# Patient Record
Sex: Female | Born: 1955 | Race: Black or African American | Hispanic: No | Smoking: Former smoker
Health system: Southern US, Community
[De-identification: ages and names within clinical notes are randomized; demographics above are authoritative.]

## PROBLEM LIST (undated history)

## (undated) DIAGNOSIS — K759 Inflammatory liver disease, unspecified: Secondary | ICD-10-CM

## (undated) DIAGNOSIS — I10 Essential (primary) hypertension: Secondary | ICD-10-CM

## (undated) DIAGNOSIS — M1990 Unspecified osteoarthritis, unspecified site: Secondary | ICD-10-CM

## (undated) DIAGNOSIS — J45909 Unspecified asthma, uncomplicated: Secondary | ICD-10-CM

## (undated) HISTORY — PX: HERNIA REPAIR: SHX51

---

## 2019-08-10 ENCOUNTER — Emergency Department (HOSPITAL_COMMUNITY)
Admission: EM | Admit: 2019-08-10 | Discharge: 2019-08-10 | Disposition: A | Discharge status: Home / Self Care | Payer: Medicaid Other | Attending: Emergency Medicine | Admitting: Emergency Medicine

## 2019-08-10 ENCOUNTER — Encounter (HOSPITAL_COMMUNITY): Payer: Self-pay | Admitting: Emergency Medicine

## 2019-08-10 DIAGNOSIS — Z5321 Procedure and treatment not carried out due to patient leaving prior to being seen by health care provider: Secondary | ICD-10-CM | POA: Diagnosis not present

## 2019-08-10 DIAGNOSIS — M7918 Myalgia, other site: Secondary | ICD-10-CM | POA: Diagnosis present

## 2019-08-10 NOTE — ED Triage Notes (Signed)
Pt c/o bilat hip and leg pains 3 weeks. Had couple falls. Unable to walk due to pains.

## 2020-01-09 ENCOUNTER — Emergency Department (HOSPITAL_COMMUNITY): Payer: Medicaid Other

## 2020-01-09 ENCOUNTER — Encounter (HOSPITAL_COMMUNITY): Payer: Self-pay

## 2020-01-09 ENCOUNTER — Emergency Department (HOSPITAL_COMMUNITY)
Admission: EM | Admit: 2020-01-09 | Discharge: 2020-01-10 | Disposition: A | Discharge status: Home / Self Care | Payer: Medicaid Other | Attending: Emergency Medicine | Admitting: Emergency Medicine

## 2020-01-09 ENCOUNTER — Other Ambulatory Visit: Payer: Self-pay

## 2020-01-09 DIAGNOSIS — R1012 Left upper quadrant pain: Secondary | ICD-10-CM | POA: Diagnosis not present

## 2020-01-09 DIAGNOSIS — R1013 Epigastric pain: Secondary | ICD-10-CM | POA: Diagnosis present

## 2020-01-09 HISTORY — DX: Unspecified osteoarthritis, unspecified site: M19.90

## 2020-01-09 LAB — CBC WITH DIFFERENTIAL/PLATELET
Abs Immature Granulocytes: 0.01 10*3/uL (ref 0.00–0.07)
Basophils Absolute: 0 10*3/uL (ref 0.0–0.1)
Basophils Relative: 1 %
Eosinophils Absolute: 0.1 10*3/uL (ref 0.0–0.5)
Eosinophils Relative: 3 %
HCT: 37.2 % (ref 36.0–46.0)
Hemoglobin: 12.2 g/dL (ref 12.0–15.0)
Immature Granulocytes: 0 %
Lymphocytes Relative: 40 %
Lymphs Abs: 1.9 10*3/uL (ref 0.7–4.0)
MCH: 31 pg (ref 26.0–34.0)
MCHC: 32.8 g/dL (ref 30.0–36.0)
MCV: 94.4 fL (ref 80.0–100.0)
Monocytes Absolute: 0.6 10*3/uL (ref 0.1–1.0)
Monocytes Relative: 13 %
Neutro Abs: 2.1 10*3/uL (ref 1.7–7.7)
Neutrophils Relative %: 43 %
Platelets: 350 10*3/uL (ref 150–400)
RBC: 3.94 MIL/uL (ref 3.87–5.11)
RDW: 11.4 % — ABNORMAL LOW (ref 11.5–15.5)
WBC: 4.8 10*3/uL (ref 4.0–10.5)
nRBC: 0 % (ref 0.0–0.2)

## 2020-01-09 LAB — COMPREHENSIVE METABOLIC PANEL
ALT: 16 U/L (ref 0–44)
AST: 22 U/L (ref 15–41)
Albumin: 3.8 g/dL (ref 3.5–5.0)
Alkaline Phosphatase: 55 U/L (ref 38–126)
Anion gap: 13 (ref 5–15)
BUN: 20 mg/dL (ref 8–23)
CO2: 28 mmol/L (ref 22–32)
Calcium: 9.9 mg/dL (ref 8.9–10.3)
Chloride: 101 mmol/L (ref 98–111)
Creatinine, Ser: 0.75 mg/dL (ref 0.44–1.00)
GFR, Estimated: >60 mL/min (ref >60)
Glucose, Bld: 104 mg/dL — ABNORMAL HIGH (ref 70–99)
Potassium: 3 mmol/L — ABNORMAL LOW (ref 3.5–5.1)
Sodium: 142 mmol/L (ref 135–145)
Total Bilirubin: 0.6 mg/dL (ref 0.3–1.2)
Total Protein: 9.1 g/dL — ABNORMAL HIGH (ref 6.5–8.1)

## 2020-01-09 LAB — LIPASE, BLOOD: Lipase: 27 U/L (ref 11–51)

## 2020-01-09 LAB — ETHANOL: Alcohol, Ethyl (B): <10 mg/dL (ref <10)

## 2020-01-09 LAB — ACETAMINOPHEN LEVEL: Acetaminophen (Tylenol), Serum: <10 ug/mL — ABNORMAL LOW (ref 10–30)

## 2020-01-09 LAB — LACTIC ACID, PLASMA: Lactic Acid, Venous: 1.2 mmol/L (ref 0.5–1.9)

## 2020-01-09 LAB — SALICYLATE LEVEL: Salicylate Lvl: <7 mg/dL — ABNORMAL LOW (ref 7.0–30.0)

## 2020-01-09 MED ORDER — SODIUM CHLORIDE 0.9 % IV BOLUS
1000.0000 mL | Freq: Once | INTRAVENOUS | Status: AC
  Administered 2020-01-09: 1000 mL via INTRAVENOUS

## 2020-01-09 MED ORDER — MORPHINE SULFATE (PF) 4 MG/ML IV SOLN
4.0000 mg | Freq: Once | INTRAVENOUS | Status: AC
  Administered 2020-01-09: 4 mg via INTRAVENOUS
  Filled 2020-01-09: qty 1

## 2020-01-09 MED ORDER — IOHEXOL 300 MG/ML  SOLN
100.0000 mL | Freq: Once | INTRAMUSCULAR | Status: AC | PRN
  Administered 2020-01-09: 100 mL via INTRAVENOUS

## 2020-01-09 MED ORDER — POTASSIUM CHLORIDE CRYS ER 20 MEQ PO TBCR
40.0000 meq | EXTENDED_RELEASE_TABLET | Freq: Once | ORAL | Status: AC
  Administered 2020-01-10: 40 meq via ORAL
  Filled 2020-01-09: qty 2

## 2020-01-09 NOTE — ED Notes (Signed)
Ultrasound at pt bedside 

## 2020-01-09 NOTE — ED Triage Notes (Signed)
Patient arrived with complaints of abdominal pain over the last 4 days. Declines NVD. Per EMS family mentions history of substance abuse, reports finding a bottle of Trazadone filled 5 days ago for one tablet a day with 15 missing from bottle.

## 2020-01-09 NOTE — Discharge Instructions (Signed)
The CT scan and ultrasound shows gall bladder sludge. This is not a surgical emergency. If she continues to have this abdominal pain she should follow up outpatient with the surgery clinic. Their office information is included in discharge paperwork.  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 You should follow up with an orthopedist for this if you have hip pain and have not seen on before. I have included the number for the on call orthopedist Dr. Ranell Patrick.  You should

## 2020-01-09 NOTE — ED Notes (Signed)
Patient transported to CT 

## 2020-01-09 NOTE — ED Provider Notes (Signed)
Medical screening examination/treatment/procedure(s) were conducted as a shared visit with non-physician practitioner(s) and myself.  I personally evaluated the patient during the encounter.  EKG Interpretation  Date/Time:  Tuesday January 09 2020 20:37:35 EDT Ventricular Rate:  86 PR Interval:    QRS Duration: 81 QT Interval:  408 QTC Calculation: 488 R Axis:   69 Text Interpretation: Sinus rhythm LAE, consider biatrial enlargement Borderline prolonged QT interval No old tracing to compare Confirmed by Lorre Nick ([id number]) on 01/09/2020 9:38:4 PM  64 year old female presents with 4 days abdominal pain characterized to her left upper quadrant but diffuse in nature.  On exam she is diffusely tender.  Will order abdominal CT and labs and disposition pending at this time.   Lorre Nick, MD 01/09/20 2136

## 2020-01-09 NOTE — ED Provider Notes (Addendum)
[facility]-EMERGENCY DEPT Provider Note   CSN: [id number] Arrival date & time: 01/09/20  2019     History Chief Complaint  Patient presents with  . Abdominal Pain    Kara Fox is a 64 y.o. female with past medical history significant for alcohol and substance abuse, arthritis. Abdominal surgical history includes hernia repair and C sections.  HPI Patient presents to emergency department today via EMS with chief complaint of LUQ abdominal pain x 4 days. Pain is intermittent. She describes the pain as a cramping sensation. She rates pain 7/10 in severity. Pain is located in her LUQ and does not radiate. Pain is not worse with eating or drinking. She has been taking her home medications for pain, denies any additional OTC medications. She denies drinking any alcohol today, states she drank yesterday. She admits to drinking frequently. She was recently prescribed flagyl to help decrease alcohol consumption. Denies history of similar pain. Also denies fever, chills, shortness of breath, chest pain, nausea, emesis, urinary frequency, gross hematuria, diarrhea, black tarry stool. Last bowel movement was this morning and was normal for her.  Patient gave approval to contact daughter for additional history. Patient's daughter states she has long history of alcohol abuse. She was recently at a detox clinic for 2 weeks discharged on 10/22. Patient goes to local methadone clinic. She does not know of any recent failed urine drug tests. Patient has a prescription for trazodone that daughter filled on 10/28. She is supposed to only take 1 at night, daughter noticed there were 14 pills missing. Patient denies overdose or any thoughts of self harm. She denies taking large quantities of trazodone today or recently.       Past Medical History:  Diagnosis Date  . Arthritis     There are no problems to display for this patient.   No past surgical history on file.   OB  History   No obstetric history on file.     No family history on file.  Social History   Tobacco Use  . Smoking status: Not on file  Substance Use Topics  . Alcohol use: Not on file  . Drug use: Not on file    Home Medications Prior to Admission medications   Medication Sig Start Date End Date Taking? Authorizing Provider  celecoxib (CELEBREX) 100 MG capsule Take 100 mg by mouth 2 (two) times daily. 11/21/19  Yes [provider]  DULoxetine (CYMBALTA) 30 MG capsule Take 30 mg by mouth 2 (two) times daily.  11/21/19  Yes [provider]  hydrOXYzine (VISTARIL) 25 MG capsule Take 25 mg by mouth every 6 (six) hours as needed for anxiety or itching.  01/04/20  Yes [provider]  Misc Natural Products (TURMERIC CURCUMIN) CAPS Take 1 capsule by mouth daily.  11/21/19  Yes [provider]  traZODone (DESYREL) 50 MG tablet Take 50 mg by mouth at bedtime. 01/04/20  Yes [provider]    Allergies    Patient has no known allergies.  Review of Systems   Review of Systems All other systems are reviewed and are negative for acute change except as noted in the HPI.  Physical Exam Updated Vital Signs BP (!) 141/89 (BP Location: Left Arm)   Pulse 88   Temp 98.4 F (36.9 C) (Oral)   Resp 15   SpO2 100%   Physical Exam Vitals and nursing note reviewed.  Constitutional:      General: She is not in  acute distress.    Appearance: She is not ill-appearing.  HENT:     Head: Normocephalic and atraumatic.     Right Ear: Tympanic membrane and external ear normal.     Left Ear: Tympanic membrane and external ear normal.     Nose: Nose normal.     Mouth/Throat:     Mouth: Mucous membranes are dry.     Pharynx: Oropharynx is clear.  Eyes:     General: No scleral icterus.       Right eye: No discharge.        Left eye: No discharge.     Extraocular Movements: Extraocular movements intact.     Conjunctiva/sclera: Conjunctivae normal.      Pupils: Pupils are equal, round, and reactive to light.  Neck:     Vascular: No JVD.  Cardiovascular:     Rate and Rhythm: Normal rate and regular rhythm.     Pulses: Normal pulses.          Radial pulses are 2+ on the right side and 2+ on the left side.     Heart sounds: Normal heart sounds.  Pulmonary:     Comments: Lungs clear to auscultation in all fields. Symmetric chest rise. No wheezing, rales, or rhonchi. Abdominal:     Comments: Abdomen is soft, non-distended. Tender to palpation of LUQ and LLQ. No rigidity, no guarding. No peritoneal signs. No CVA tenderness.  Musculoskeletal:        General: Normal range of motion.     Cervical back: Normal range of motion.  Skin:    General: Skin is warm and dry.     Capillary Refill: Capillary refill takes less than 2 seconds.  Neurological:     Mental Status: She is oriented to person, place, and time.     GCS: GCS eye subscore is 4. GCS verbal subscore is 5. GCS motor subscore is 6.     Comments: Fluent speech, no facial droop.  Psychiatric:        Behavior: Behavior normal.     ED Results / Procedures / Treatments   Labs (all labs ordered are listed, but only abnormal results are displayed) Labs Reviewed  CBC WITH DIFFERENTIAL/PLATELET - Abnormal; Notable for the following components:      Result Value   RDW 11.4 (*)    All other components within normal limits  COMPREHENSIVE METABOLIC PANEL - Abnormal; Notable for the following components:   Potassium 3.0 (*)    Glucose, Bld 104 (*)    Total Protein 9.1 (*)    All other components within normal limits  ACETAMINOPHEN LEVEL - Abnormal; Notable for the following components:   Acetaminophen (Tylenol), Serum <10 (*)    All other components within normal limits  SALICYLATE LEVEL - Abnormal; Notable for the following components:   Salicylate Lvl <7.0 (*)    All other components within normal limits  ETHANOL  LIPASE, BLOOD  LACTIC ACID, PLASMA    EKG EKG  Interpretation  Date/Time:  Tuesday January 09 2020 20:37:35 EDT Ventricular Rate:  86 PR Interval:    QRS Duration: 81 QT Interval:  408 QTC Calculation: 488 R Axis:   69 Text Interpretation: Sinus rhythm LAE, consider biatrial enlargement Borderline prolonged QT interval No old tracing to compare Confirmed by Lorre Nick ([id number]) on 01/09/2020 9:19:58 PM   Radiology CT ABDOMEN PELVIS W CONTRAST  Result Date: 01/09/2020 CLINICAL DATA:  Left upper quadrant pain.  History of substance use. EXAM: CT  ABDOMEN AND PELVIS WITH CONTRAST TECHNIQUE: Multidetector CT imaging of the abdomen and pelvis was performed using the standard protocol following bolus administration of intravenous contrast. CONTRAST:  OMNIPAQUE IOHEXOL 300 MG/ML  SOLN COMPARISON:  None. FINDINGS: Lower chest: Bilateral lower lobe subsegmental atelectasis. No acute abnormality. Hepatobiliary: No focal liver abnormality. Layering hyperdensity within the gallbladder lumen suggestive of gallbladder sludge versus tiny gallstones. No gallbladder wall thickening or pericholecystic fluid. No biliary dilatation. Pancreas: No focal lesion. Normal pancreatic contour. No surrounding inflammatory changes. No main pancreatic ductal dilatation. Spleen: Normal in size without focal abnormality. Adrenals/Urinary Tract: No adrenal nodule bilaterally. Bilateral kidneys enhance symmetrically. No hydronephrosis. No hydroureter. The urinary bladder is unremarkable. Stomach/Bowel: Stomach is within normal limits. No evidence of bowel wall thickening or dilatation. Appendix appears normal. Vascular/Lymphatic: No abdominal aorta or iliac aneurysm. Mild atherosclerotic plaque of the aorta and its branches. No abdominal, pelvic, or inguinal lymphadenopathy. Reproductive: Uterus and bilateral adnexa are unremarkable. Other: No intraperitoneal free fluid. No intraperitoneal free gas. No organized fluid collection. Musculoskeletal: Suggestion of ventral hernia  repair. No abdominal wall hernia or abnormality No suspicious lytic or blastic osseous lesions. No acute displaced fracture. Multilevel degenerative changes of the spine with grade 1 anterolisthesis of L4 on L5. Extensive bilateral hip degenerative changes with remodeling of the right femoral head. IMPRESSION: 1. Gallbladder sludge versus tiny gallstones with no findings to suggest acute cholecystitis or choledocholithiasis. 2. Extensive bilateral hip degenerative changes with remodeling of the right femoral head. If not already followed, please consider nonemergent orthopedic consultation. Electronically Signed   By: Tish Frederickson M.D.   On: 01/09/2020 22:14   US Abdomen Limited RUQ (LIVER/GB)  Result Date: 01/09/2020 CLINICAL DATA:  Epigastric pain for 4 days EXAM: ULTRASOUND ABDOMEN LIMITED RIGHT UPPER QUADRANT COMPARISON:  01/09/2020 FINDINGS: Gallbladder: Gallbladder sludge layers dependently within the gallbladder. No shadowing gallstones or evidence of cholecystitis. Common bile duct: Diameter: 4 mm Liver: No focal lesion identified. Within normal limits in parenchymal echogenicity. Portal vein is patent on color Doppler imaging with normal direction of blood flow towards the liver. Other: None. IMPRESSION: 1. Gallbladder sludge. No evidence of cholelithiasis or cholecystitis. Electronically Signed   By: Sharlet Salina M.D.   On: 01/09/2020 23:00    Procedures Procedures (including critical care time)  Medications Ordered in ED Medications  potassium chloride SA (KLOR-CON) CR tablet 40 mEq (has no administration in time range)  sodium chloride 0.9 % bolus 1,000 mL (1,000 mLs Intravenous New Bag/Given 01/09/20 2132)  iohexol (OMNIPAQUE) 300 MG/ML solution 100 mL (100 mLs Intravenous Contrast Given 01/09/20 2149)  morphine 4 MG/ML injection 4 mg (4 mg Intravenous Given 01/09/20 2240)    ED Course  I have reviewed the triage vital signs and the nursing notes.  Pertinent labs & imaging results  that were available during my care of the patient were reviewed by me and considered in my medical decision making (see chart for details).    MDM Rules/Calculators/A&P                          History provided by patient with additional history obtained from chart review.    Patient presents to the ED with complaints of abdominal pain. Patient nontoxic appearing, in no apparent distress, vitals WNL. On exam patient tender to LUQ abd LLQ, no peritoneal signs. Will evaluate with labs and CT. Analgesics and fluids administered.  Labs reviewed and grossly unremarkable. No leukocytosis, no anemia.  Mild hypokalemia 3.0, will replete PO. No lactic acidosis. LFTs, renal function, and lipase WNL. Ethanol, acetaminophen level, salicylate levels all negative. Patient used the restroom without getting UA. She declines any urinary symptoms and does not feel she can provide another sample, ordered discontinued.  RUQ US shows gall bladder sludge. CT AP shows gallbladder sludge, no gallstones or evidence of cholecystitis. Radiologist comments on extensive bilateral hip degenerative changes with remodeling of the right femoral head.  Nonemergent orthopedic consultation recommended. She has full ROM of bilateral hips, pelvis is stable. No overlying skin changes . On repeat abdominal exam patient remains without peritoneal signs, doubt cholecystitis, pancreatitis, diverticulitis, appendicitis, bowel obstruction/perforation.  Patient tolerating PO in the emergency department. Will discharge home with supportive measures. Recommend follow up in surgery clinic as well as ortho for hip pain. I discussed results, treatment plan,and return precautions with the patient and her daughter. Provided opportunity for questions, patient confirmed understanding and is in agreement with plan. Findings and plan of care discussed with supervising physician Dr. Freida BusmanAllen.   Portions of this note were generated with [employer]ist[employer] dictation  software. Dictation errors may occur despite best attempts at proofreading.    Final Clinical Impression(s) / ED Diagnoses Final diagnoses:  Epigastric pain    Rx / DC Orders ED Discharge Orders    None       Shanon AceWalisiewicz, Fortune Brannigan E, PA-C 01/10/20 0014    Shanon AceWalisiewicz, Mckayla Mulcahey E, PA-C 01/10/20 0035    Lorre NickAllen, Anthony, MD 01/10/20 2336

## 2020-01-10 NOTE — ED Notes (Signed)
Contacted pts daughter for pt pick up after discharge. Pts daughter states she would be calling an [employer] to pick up.

## 2020-05-16 ENCOUNTER — Other Ambulatory Visit (HOSPITAL_COMMUNITY): Payer: Self-pay | Admitting: Internal Medicine

## 2020-05-20 ENCOUNTER — Other Ambulatory Visit: Payer: Self-pay | Admitting: Internal Medicine

## 2020-05-20 DIAGNOSIS — R5381 Other malaise: Secondary | ICD-10-CM

## 2020-05-24 ENCOUNTER — Other Ambulatory Visit: Payer: Self-pay | Admitting: Physician Assistant

## 2020-05-24 DIAGNOSIS — Z1231 Encounter for screening mammogram for malignant neoplasm of breast: Secondary | ICD-10-CM

## 2020-06-11 ENCOUNTER — Other Ambulatory Visit (HOSPITAL_COMMUNITY): Payer: Self-pay

## 2020-06-11 MED ORDER — DICLOFENAC SODIUM 3 % EX GEL
1.0000 "application " | Freq: Two times a day (BID) | CUTANEOUS | 5 refills | Status: DC
  Filled 2020-06-11: qty 100, 5d supply, fill #0

## 2020-06-11 MED ORDER — CELECOXIB 100 MG PO CAPS
100.0000 mg | ORAL_CAPSULE | Freq: Two times a day (BID) | ORAL | 3 refills | Status: DC
  Filled 2020-06-11: qty 60, 30d supply, fill #0

## 2020-06-11 MED ORDER — HYDROCHLOROTHIAZIDE 12.5 MG PO TABS
12.5000 mg | ORAL_TABLET | Freq: Every day | ORAL | 4 refills | Status: DC
  Filled 2020-06-11: qty 30, 30d supply, fill #0

## 2020-06-11 MED ORDER — GABAPENTIN 100 MG PO CAPS
100.0000 mg | ORAL_CAPSULE | Freq: Three times a day (TID) | ORAL | 3 refills | Status: DC
  Filled 2020-06-11: qty 90, 30d supply, fill #0

## 2020-06-21 ENCOUNTER — Other Ambulatory Visit (HOSPITAL_COMMUNITY): Payer: Self-pay

## 2020-06-21 MED ORDER — CHOLECALCIFEROL 25 MCG (1000 UT) PO TABS: ORAL_TABLET | ORAL | 3 refills | Status: DC

## 2020-07-22 ENCOUNTER — Other Ambulatory Visit: Payer: Self-pay | Admitting: Internal Medicine

## 2020-07-22 DIAGNOSIS — Z13820 Encounter for screening for osteoporosis: Secondary | ICD-10-CM

## 2020-07-23 ENCOUNTER — Other Ambulatory Visit: Payer: Medicaid Other

## 2020-08-26 ENCOUNTER — Emergency Department (HOSPITAL_COMMUNITY)
Admission: EM | Admit: 2020-08-26 | Discharge: 2020-08-26 | Disposition: A | Discharge status: Home / Self Care | Payer: Medicaid Other | Attending: Emergency Medicine | Admitting: Emergency Medicine

## 2020-08-26 ENCOUNTER — Other Ambulatory Visit: Payer: Self-pay

## 2020-08-26 ENCOUNTER — Encounter (HOSPITAL_COMMUNITY): Payer: Self-pay

## 2020-08-26 DIAGNOSIS — Z79899 Other long term (current) drug therapy: Secondary | ICD-10-CM | POA: Diagnosis not present

## 2020-08-26 DIAGNOSIS — M25551 Pain in right hip: Secondary | ICD-10-CM | POA: Insufficient documentation

## 2020-08-26 DIAGNOSIS — M25552 Pain in left hip: Secondary | ICD-10-CM | POA: Insufficient documentation

## 2020-08-26 DIAGNOSIS — Z760 Encounter for issue of repeat prescription: Secondary | ICD-10-CM | POA: Diagnosis not present

## 2020-08-26 DIAGNOSIS — M25562 Pain in left knee: Secondary | ICD-10-CM | POA: Diagnosis not present

## 2020-08-26 DIAGNOSIS — M25561 Pain in right knee: Secondary | ICD-10-CM | POA: Insufficient documentation

## 2020-08-26 DIAGNOSIS — Z87891 Personal history of nicotine dependence: Secondary | ICD-10-CM | POA: Diagnosis not present

## 2020-08-26 DIAGNOSIS — I10 Essential (primary) hypertension: Secondary | ICD-10-CM | POA: Insufficient documentation

## 2020-08-26 HISTORY — DX: Essential (primary) hypertension: I10

## 2020-08-26 MED ORDER — METHOCARBAMOL 500 MG PO TABS
500.0000 mg | ORAL_TABLET | Freq: Once | ORAL | Status: AC
  Administered 2020-08-26: 17:00:00 500 mg via ORAL
  Filled 2020-08-26: qty 1

## 2020-08-26 MED ORDER — ACETAMINOPHEN 500 MG PO TABS
1000.0000 mg | ORAL_TABLET | Freq: Once | ORAL | Status: AC
  Administered 2020-08-26: 17:00:00 1000 mg via ORAL
  Filled 2020-08-26: qty 2

## 2020-08-26 MED ORDER — CELECOXIB 100 MG PO CAPS: 100.0000 mg | ORAL_CAPSULE | Freq: Two times a day (BID) | ORAL | 0 refills | Status: DC

## 2020-08-26 MED ORDER — METHOCARBAMOL 500 MG PO TABS: 500.0000 mg | ORAL_TABLET | Freq: Three times a day (TID) | ORAL | 0 refills | Status: DC | PRN

## 2020-08-26 NOTE — ED Triage Notes (Signed)
Per EMS- patient states she has ran out of her arthritis medications x 3 weeks.  Patient c/o pain in her hips and knees.

## 2020-08-26 NOTE — Discharge Instructions (Addendum)
I have provided you with refills of your Celebrex and Robaxin. Is important for you to establish care with a primary care provider.  I have consulted the social worker to help arrange this for you. Return to the ER if you start to experience worsening pain, fever, chest pain or shortness of breath

## 2020-08-26 NOTE — ED Provider Notes (Signed)
[facility]-EMERGENCY DEPT Provider Note   CSN: [id number] Arrival date & time: 08/26/20  1430     History Chief Complaint  Patient presents with   Medication Refill   arthritis pain    Kara Fox is a 65 y.o. female with a past medical history of arthritis, hypertension presenting to the ED for medication refill.  States that she has been out of her medications for about 3 days.  Has been having arthritis related pain in bilateral knees and hips.  She has been dealing with this overall for several years.  She recently moved to [facility] and has not yet established care with PCP or a "bone specialist" but she is working on this.  Denies any injuries or falls, fever or numbness.  HPI     Past Medical History:  Diagnosis Date   Arthritis    Hypertension     There are no problems to display for this patient.   History reviewed. No pertinent surgical history.   OB History   No obstetric history on file.     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 Authorizing Provider  celecoxib (CELEBREX) 100 MG capsule Take 1 capsule (100 mg total) by mouth 2 (two) times daily. 08/26/20  Yes Maki Sweetser, PA-C  methocarbamol (ROBAXIN) 500 MG tablet Take 1 tablet (500 mg total) by mouth every 8 (eight) hours as needed for muscle spasms. 08/26/20  Yes Maythe Deramo, Hillary Bow, PA-C  Cholecalciferol 25 MCG (1000 UT) tablet Take 1 tablet by mouth once a day 06/11/20     Diclofenac Sodium 3 % GEL Apply 1 application topically 2 (two) times daily. 06/11/20     DULoxetine (CYMBALTA) 30 MG capsule Take 30 mg by mouth 2 (two)  times daily.  11/21/19   [provider]  folic acid (FOLVITE) 1 MG tablet TAKE 1 TABLET BY MOUTH ONCE A DAY 90 DAYS 05/16/20 05/16/21  Osei-Bonsu, Greggory Stallion, MD  gabapentin (NEURONTIN) 100 MG capsule Take 1 capsule by mouth 3 times a day 06/11/20     hydrochlorothiazide (HYDRODIURIL) 12.5 MG tablet Take 1 tablet (12.5 mg total) by mouth daily. 06/11/20     hydrOXYzine (VISTARIL) 25 MG capsule Take 25 mg by mouth every 6 (six) hours as needed for anxiety or itching.  01/04/20   [provider]  Misc Natural Products (TURMERIC CURCUMIN) CAPS Take 1 capsule by mouth daily.  11/21/19   [provider]  thiamine 100 MG tablet TAKE 1 TABLET BY MOUTH ONCE DAILY 05/16/20 05/16/21  Jackie Plum, MD  traZODone (DESYREL) 50 MG tablet Take 50 mg by mouth at bedtime. 01/04/20   [provider]    Allergies    Patient has no known allergies.  Review of Systems   Review of Systems  Constitutional:  Negative for fever.  Musculoskeletal:  Positive for arthralgias.  Neurological:  Negative for weakness and numbness.   Physical Exam Updated Vital Signs BP (!) 135/107   Pulse 86   Temp 98.9 F (37.2 C) (Oral)   Resp 16  Ht 5' (1.524 m)   Wt 57.6 kg   SpO2 100%   BMI 24.80 kg/m   Physical Exam Vitals and nursing note reviewed.  Constitutional:      General: She is not in acute distress.    Appearance: She is well-developed. She is not diaphoretic.  HENT:     Head: Normocephalic and atraumatic.  Eyes:     General: No scleral icterus.    Conjunctiva/sclera: Conjunctivae normal.  Pulmonary:     Effort: Pulmonary effort is normal. No respiratory distress.  Musculoskeletal:     Cervical back: Normal range of motion.     Comments: Normal range of motion of bilateral hips and knees without overlying skin changes.  Skin:    Findings: No rash.  Neurological:     Mental Status: She is alert.    ED Results / Procedures / Treatments   Labs (all labs ordered are  listed, but only abnormal results are displayed) Labs Reviewed - No data to display  EKG None  Radiology No results found.  Procedures Procedures   Medications Ordered in ED Medications  methocarbamol (ROBAXIN) tablet 500 mg (500 mg Oral Given 08/26/20 1642)  acetaminophen (TYLENOL) tablet 1,000 mg (1,000 mg Oral Given 08/26/20 1642)    ED Course  I have reviewed the triage vital signs and the nursing notes.  Pertinent labs & imaging results that were available during my care of the patient were reviewed by me and considered in my medical decision making (see chart for details).  Clinical Course as of 08/26/20 1646  Mon Aug 26, 2020  1621 Robaxin 500 tid prn Celebrex 100 bid Duloxetine 30 bid [HK]    Clinical Course User Index [HK] Dietrich Pates, PA-C   MDM Rules/Calculators/A&P                          65 year old female with past medical history of arthritis presenting to the ED with a chief complaint of arthritis pain needing refill of her medications.  Pill bottles at the bedside.  She is out of her Celebrex and Robaxin.  Initially gave me an empty bottle of duloxetine but on recheck she has another bottle that is full.  She denies any other complaints.  We will have her establish care with a PCP and have social work to contact her.  Return precautions given.   Patient is hemodynamically stable, in NAD, and able to ambulate in the ED. Evaluation does not show pathology that would require ongoing emergent intervention or inpatient treatment. I explained the diagnosis to the patient. Pain has been managed and has no complaints prior to discharge. Patient is comfortable with above plan and is stable for discharge at this time. All questions were answered prior to disposition. Strict return precautions for returning to the ED were discussed. Encouraged follow up with PCP.   An After Visit Summary was printed and given to the patient.   Portions of this note were generated with  Scientist, clinical (histocompatibility and immunogenetics). Dictation errors may occur despite best attempts at proofreading.  Final Clinical Impression(s) / ED Diagnoses Final diagnoses:  Encounter for medication refill    Rx / DC Orders ED Discharge Orders          Ordered    celecoxib (CELEBREX) 100 MG capsule  2 times daily        08/26/20 1646    methocarbamol (ROBAXIN) 500 MG tablet  Every 8 hours PRN  08/26/20 1646             Dietrich Pates, PA-C 08/26/20 1646    Rolan Bucco, MD 08/27/20 [phone]

## 2020-08-27 ENCOUNTER — Telehealth: Payer: Self-pay | Admitting: *Deleted

## 2020-08-27 NOTE — Telephone Encounter (Signed)
Unable to reach patient for virtual pre visit. Left message on home phone asking patient to call and reschedule pv to avoid cancellation of up-coming colonoscopy 09/26/20. Of note, cell phone number incorrect number belonged to another person.

## 2020-08-27 NOTE — Telephone Encounter (Signed)
Patient no showed PV today- Called patient and left message to return call by 5 pm today- If no call by 5 pm, PV and procedure will be canceled - no call at 5 pm -  PV and Procedure both canceled- No Show letter mailed to patient  

## 2020-09-17 ENCOUNTER — Inpatient Hospital Stay: Admission: RE | Admit: 2020-09-17 | Payer: Medicaid Other | Source: Ambulatory Visit

## 2020-09-24 ENCOUNTER — Inpatient Hospital Stay: Admission: RE | Admit: 2020-09-24 | Payer: Medicaid Other | Source: Ambulatory Visit

## 2020-09-26 ENCOUNTER — Encounter: Payer: Medicaid Other | Admitting: Gastroenterology

## 2020-10-02 ENCOUNTER — Telehealth: Payer: Self-pay | Admitting: *Deleted

## 2020-10-02 NOTE — Telephone Encounter (Signed)
Patient no show for PV today. Called patient, no answer. "Mail box is full" so I was unable to leave a message. No show letter mailed to patient today. Colon and PV cancelled.

## 2020-10-16 ENCOUNTER — Encounter: Payer: Medicaid Other | Admitting: Gastroenterology

## 2020-10-16 ENCOUNTER — Encounter: Payer: Self-pay | Admitting: Gastroenterology

## 2020-11-05 ENCOUNTER — Telehealth: Payer: Self-pay | Admitting: *Deleted

## 2020-11-05 NOTE — Telephone Encounter (Signed)
Patient no show and did not call back today. Mailed no show letter.

## 2020-11-05 NOTE — Telephone Encounter (Signed)
Patient no show PV appointment for today. I called patient, no answer, left a message for the patient to call us back today. 

## 2020-11-05 NOTE — Telephone Encounter (Signed)
Patient no show PV appointment for today. I called patient, no answer, left a message for the patient to call us back today before 5 pm or the PV and procedure will be cancelled  

## 2020-11-18 ENCOUNTER — Encounter: Payer: Medicaid Other | Admitting: Gastroenterology

## 2021-01-08 ENCOUNTER — Inpatient Hospital Stay: Admission: RE | Admit: 2021-01-08 | Payer: Medicaid Other | Source: Ambulatory Visit

## 2021-04-15 ENCOUNTER — Other Ambulatory Visit: Payer: Self-pay | Admitting: Internal Medicine

## 2021-04-15 ENCOUNTER — Other Ambulatory Visit: Payer: Self-pay

## 2021-04-15 MED ORDER — GABAPENTIN 100 MG PO CAPS
ORAL_CAPSULE | ORAL | 3 refills | Status: DC
  Filled 2021-04-15: qty 90, 30d supply, fill #0

## 2021-04-15 MED ORDER — HYDROCHLOROTHIAZIDE 12.5 MG PO TABS
ORAL_TABLET | ORAL | 5 refills | Status: DC
  Filled 2021-04-15: qty 30, 30d supply, fill #0

## 2021-04-15 MED ORDER — CELECOXIB 100 MG PO CAPS
ORAL_CAPSULE | ORAL | 3 refills | Status: DC
  Filled 2021-04-15: qty 60, 30d supply, fill #0

## 2021-04-22 ENCOUNTER — Other Ambulatory Visit: Payer: Self-pay

## 2021-08-20 ENCOUNTER — Other Ambulatory Visit: Payer: Self-pay

## 2021-08-20 MED ORDER — GABAPENTIN 100 MG PO CAPS
ORAL_CAPSULE | ORAL | 3 refills | Status: DC
  Filled 2021-08-20: qty 90, 30d supply, fill #0

## 2021-08-20 MED ORDER — HYDROCHLOROTHIAZIDE 12.5 MG PO TABS
ORAL_TABLET | ORAL | 5 refills | Status: DC
  Filled 2021-08-20: qty 30, 30d supply, fill #0

## 2021-08-27 ENCOUNTER — Other Ambulatory Visit: Payer: Self-pay

## 2021-08-28 ENCOUNTER — Encounter: Payer: Self-pay | Admitting: Podiatry

## 2021-08-28 ENCOUNTER — Ambulatory Visit (INDEPENDENT_AMBULATORY_CARE_PROVIDER_SITE_OTHER): Payer: Medicaid Other | Admitting: Podiatry

## 2021-08-28 DIAGNOSIS — M79675 Pain in left toe(s): Secondary | ICD-10-CM | POA: Diagnosis not present

## 2021-08-28 DIAGNOSIS — B351 Tinea unguium: Secondary | ICD-10-CM | POA: Diagnosis not present

## 2021-08-28 DIAGNOSIS — M79674 Pain in right toe(s): Secondary | ICD-10-CM

## 2021-09-03 NOTE — Progress Notes (Signed)
  Subjective:  Patient ID: Kara Fox, female    DOB: 04/01/1955,  MRN: [id number]  Chief Complaint  Patient presents with   Nail Problem    Nail trim    66 y.o. female returns for the above complaint.  Patient presents with thickened elongated dystrophic toenails x10 hurts with ambulation.  Patient would like to have it debrided down she is not able to do it herself.  She denies any other acute complaints  Objective:  There were no vitals filed for this visit. Podiatric Exam: Vascular: dorsalis pedis and posterior tibial pulses are palpable bilateral. Capillary return is immediate. Temperature gradient is WNL. Skin turgor WNL  Sensorium: Normal Semmes Weinstein monofilament test. Normal tactile sensation bilaterally. Nail Exam: Pt has thick disfigured discolored nails with subungual debris noted bilateral entire nail hallux through fifth toenails.  Pain on palpation to the nails. Ulcer Exam: There is no evidence of ulcer or pre-ulcerative changes or infection. Orthopedic Exam: Muscle tone and strength are WNL. No limitations in general ROM. No crepitus or effusions noted.  Skin: No Porokeratosis. No infection or ulcers    Assessment & Plan:   1. Pain due to onychomycosis of toenails of both feet     Patient was evaluated and treated and all questions answered.  Onychomycosis with pain  -Nails palliatively debrided as below. -Educated on self-care  Procedure: Nail Debridement Rationale: pain  Type of Debridement: manual, sharp debridement. Instrumentation: Nail nipper, rotary burr. Number of Nails: 10  Procedures and Treatment: Consent by patient was obtained for treatment procedures. The patient understood the discussion of treatment and procedures well. All questions were answered thoroughly reviewed. Debridement of mycotic and hypertrophic toenails, 1 through 5 bilateral and clearing of subungual debris. No ulceration, no infection noted.  Return Visit-Office  Procedure: Patient instructed to return to the office for a follow up visit 3 months for continued evaluation and treatment.  Nicholes Rough, DPM    Return in about 3 months (around 11/28/2021).

## 2021-11-20 ENCOUNTER — Other Ambulatory Visit: Payer: Self-pay

## 2021-11-20 MED ORDER — GABAPENTIN 100 MG PO CAPS
100.0000 mg | ORAL_CAPSULE | Freq: Three times a day (TID) | ORAL | 3 refills | Status: DC
  Filled 2021-11-20 (×2): qty 90, 30d supply, fill #0

## 2021-11-20 MED ORDER — HYDROCHLOROTHIAZIDE 12.5 MG PO TABS
12.5000 mg | ORAL_TABLET | Freq: Every day | ORAL | 5 refills | Status: DC
  Filled 2021-11-20: qty 30, 30d supply, fill #0

## 2021-11-20 MED ORDER — CELECOXIB 100 MG PO CAPS
100.0000 mg | ORAL_CAPSULE | Freq: Two times a day (BID) | ORAL | 2 refills | Status: DC
  Filled 2021-11-20: qty 60, 30d supply, fill #0

## 2021-11-21 ENCOUNTER — Other Ambulatory Visit: Payer: Self-pay

## 2021-11-27 ENCOUNTER — Other Ambulatory Visit: Payer: Self-pay

## 2021-11-28 ENCOUNTER — Other Ambulatory Visit: Payer: Self-pay

## 2021-12-01 ENCOUNTER — Ambulatory Visit (INDEPENDENT_AMBULATORY_CARE_PROVIDER_SITE_OTHER): Payer: Medicaid Other | Admitting: Podiatry

## 2021-12-01 DIAGNOSIS — Z91199 Patient's noncompliance with other medical treatment and regimen due to unspecified reason: Secondary | ICD-10-CM

## 2021-12-01 NOTE — Progress Notes (Signed)
1. No-show for appointment     

## 2022-03-24 ENCOUNTER — Ambulatory Visit: Payer: Self-pay | Admitting: Student

## 2022-03-31 NOTE — Patient Instructions (Signed)
DUE TO COVID-19 ONLY TWO VISITORS  (aged 65 and older)  ARE ALLOWED TO COME WITH YOU AND STAY IN THE WAITING ROOM ONLY DURING PRE OP AND PROCEDURE.   **NO VISITORS ARE ALLOWED IN THE SHORT STAY AREA OR RECOVERY ROOM!!**  IF YOU WILL BE ADMITTED INTO THE HOSPITAL YOU ARE ALLOWED ONLY FOUR SUPPORT PEOPLE DURING VISITATION HOURS ONLY (7 AM -8PM)   The support person(s) must pass our screening, gel in and out, and wear a mask at all times, including in the patient's room. Patients must also wear a mask when staff or their support person are in the room. Visitors GUEST BADGE MUST BE WORN VISIBLY  One adult visitor may remain with you overnight and MUST be in the room by 8 P.M.     Your procedure is scheduled on: 04/09/22   Report to [facility] Main Entrance    Report to admitting at: 10:20 AM   Call this number if you have problems the morning of surgery [phone]   Do not eat food :After Midnight.   After Midnight you may have the following liquids until : 9:45 AM DAY OF SURGERY  Water Black Coffee (sugar ok, NO MILK/CREAM OR CREAMERS)  Tea (sugar ok, NO MILK/CREAM OR CREAMERS) regular and decaf                             Plain Jell-O (NO RED)                                           Fruit ices (not with fruit pulp, NO RED)                                     Popsicles (NO RED)                                                                  Juice: apple, WHITE grape, WHITE cranberry Sports drinks like Gatorade (NO RED)   The day of surgery:  Drink ONE (1) Pre-Surgery Clear Ensure or G2 at : 9:45 AM the morning of surgery. Drink in one sitting. Do not sip.  This drink was given to you during your hospital  pre-op appointment visit. Nothing else to drink after completing the  Pre-Surgery Clear Ensure or G2.          If you have questions, please contact your surgeon's office.    Oral Hygiene is also important to reduce your risk of infection.                                     Remember - BRUSH YOUR TEETH THE MORNING OF SURGERY WITH YOUR REGULAR TOOTHPASTE  DENTURES WILL BE REMOVED PRIOR TO SURGERY PLEASE DO NOT APPLY "Poly grip" OR ADHESIVES!!!   Do NOT smoke after Midnight   Take these medicines the morning of surgery with A SIP OF WATER:gabapentin,duloxetine.Hydroxyzine as needed.  You may not have any metal on your body including hair pins, jewelry, and body piercing             Do not wear make-up, lotions, powders, perfumes/cologne, or deodorant  Do not wear nail polish including gel and S&S, artificial/acrylic nails, or any other type of covering on natural nails including finger and toenails. If you have artificial nails, gel coating, etc. that needs to be removed by a nail salon please have this removed prior to surgery or surgery may need to be canceled/ delayed if the surgeon/ anesthesia feels like they are unable to be safely monitored.   Do not shave  48 hours prior to surgery.    Do not bring valuables to the hospital. [place].   Contacts, glasses, or bridgework may not be worn into surgery.   Bring small overnight bag day of surgery.   DO NOT [place]. PHARMACY WILL DISPENSE MEDICATIONS LISTED ON YOUR MEDICATION LIST TO YOU DURING YOUR ADMISSION [place]!    Patients discharged on the day of surgery will not be allowed to drive home.  Someone NEEDS to stay with you for the first 24 hours after anesthesia.   Special Instructions: Bring a copy of your healthcare power of attorney and living will documents         the day of surgery if you haven't scanned them before.              Please read over the following fact sheets you were given: IF YOU HAVE QUESTIONS ABOUT YOUR PRE-OP INSTRUCTIONS PLEASE CALL [phone]    [facility] - Preparing for Surgery Before surgery, you can play an important role.  Because skin is  not sterile, your skin needs to be as free of germs as possible.  You can reduce the number of germs on your skin by washing with CHG (chlorahexidine gluconate) soap before surgery.  CHG is an antiseptic cleaner which kills germs and bonds with the skin to continue killing germs even after washing. Please DO NOT use if you have an allergy to CHG or antibacterial soaps.  If your skin becomes reddened/irritated stop using the CHG and inform your nurse when you arrive at Short Stay. Do not shave (including legs and underarms) for at least 48 hours prior to the first CHG shower.  You may shave your face/neck. Please follow these instructions carefully:  1.  Shower with CHG Soap the night before surgery and the  morning of Surgery.  2.  If you choose to wash your hair, wash your hair first as usual with your  normal  shampoo.  3.  After you shampoo, rinse your hair and body thoroughly to remove the  shampoo.                           4.  Use CHG as you would any other liquid soap.  You can apply chg directly  to the skin and wash                       Gently with a scrungie or clean washcloth.  5.  Apply the CHG Soap to your body ONLY FROM THE NECK DOWN.   Do not use on face/ open  Wound or open sores. Avoid contact with eyes, ears mouth and genitals (private parts).                       Wash face,  Genitals (private parts) with your normal soap.             6.  Wash thoroughly, paying special attention to the area where your surgery  will be performed.  7.  Thoroughly rinse your body with warm water from the neck down.  8.  DO NOT shower/wash with your normal soap after using and rinsing off  the CHG Soap.                9.  Pat yourself dry with a clean towel.            10.  Wear clean pajamas.            11.  Place clean sheets on your bed the night of your first shower and do not  sleep with pets. Day of Surgery : Do not apply any lotions/deodorants the morning of surgery.   Please wear clean clothes to the hospital/surgery center.  FAILURE TO FOLLOW THESE INSTRUCTIONS MAY RESULT IN THE CANCELLATION OF YOUR SURGERY PATIENT SIGNATURE_________________________________  NURSE SIGNATURE__________________________________  ________________________________________________________________________  Adam Phenix  An incentive spirometer is a tool that can help keep your lungs clear and active. This tool measures how well you are filling your lungs with each breath. Taking long deep breaths may help reverse or decrease the chance of developing breathing (pulmonary) problems (especially infection) following: A long period of time when you are unable to move or be active. BEFORE THE PROCEDURE  If the spirometer includes an indicator to show your best effort, your nurse or respiratory therapist will set it to a desired goal. If possible, sit up straight or lean slightly forward. Try not to slouch. Hold the incentive spirometer in an upright position. INSTRUCTIONS FOR USE  Sit on the edge of your bed if possible, or sit up as far as you can in bed or on a chair. Hold the incentive spirometer in an upright position. Breathe out normally. Place the mouthpiece in your mouth and seal your lips tightly around it. Breathe in slowly and as deeply as possible, raising the piston or the ball toward the top of the column. Hold your breath for 3-5 seconds or for as long as possible. Allow the piston or ball to fall to the bottom of the column. Remove the mouthpiece from your mouth and breathe out normally. Rest for a few seconds and repeat Steps 1 through 7 at least 10 times every 1-2 hours when you are awake. Take your time and take a few normal breaths between deep breaths. The spirometer may include an indicator to show your best effort. Use the indicator as a goal to work toward during each repetition. After each set of 10 deep breaths, practice coughing to be sure your  lungs are clear. If you have an incision (the cut made at the time of surgery), support your incision when coughing by placing a pillow or rolled up towels firmly against it. Once you are able to get out of bed, walk around indoors and cough well. You may stop using the incentive spirometer when instructed by your caregiver.  RISKS AND COMPLICATIONS Take your time so you do not get dizzy or light-headed. If you are in pain, you may need to take or ask  for pain medication before doing incentive spirometry. It is harder to take a deep breath if you are having pain. AFTER USE Rest and breathe slowly and easily. It can be helpful to keep track of a log of your progress. Your caregiver can provide you with a simple table to help with this. If you are using the spirometer at home, follow these instructions: [place] IF:  You are having difficultly using the spirometer. You have trouble using the spirometer as often as instructed. Your pain medication is not giving enough relief while using the spirometer. You develop fever of 100.5 F (38.1 C) or higher. SEEK IMMEDIATE MEDICAL CARE IF:  You cough up bloody sputum that had not been present before. You develop fever of 102 F (38.9 C) or greater. You develop worsening pain at or near the incision site. MAKE SURE YOU:  Understand these instructions. Will watch your condition. Will get help right away if you are not doing well or get worse. Document Released: 07/06/2006 Document Revised: 05/18/2011 Document Reviewed: 09/06/2006 [facility] Patient Information 2014 [place], [place].   ________________________________________________________________________

## 2022-04-01 ENCOUNTER — Other Ambulatory Visit: Payer: Self-pay

## 2022-04-01 ENCOUNTER — Encounter (HOSPITAL_COMMUNITY)
Admission: RE | Admit: 2022-04-01 | Discharge: 2022-04-01 | Disposition: A | Discharge status: Home / Self Care | Payer: Medicaid Other | Source: Ambulatory Visit | Attending: Orthopedic Surgery | Admitting: Orthopedic Surgery

## 2022-04-01 ENCOUNTER — Encounter (HOSPITAL_COMMUNITY): Payer: Self-pay

## 2022-04-01 VITALS — BP 115/74 | HR 72 | Temp 98.2°F | Ht 61.0 in | Wt 129.0 lb

## 2022-04-01 DIAGNOSIS — Z01818 Encounter for other preprocedural examination: Secondary | ICD-10-CM | POA: Diagnosis not present

## 2022-04-01 DIAGNOSIS — I2510 Atherosclerotic heart disease of native coronary artery without angina pectoris: Secondary | ICD-10-CM | POA: Diagnosis not present

## 2022-04-01 HISTORY — DX: Inflammatory liver disease, unspecified: K75.9

## 2022-04-01 LAB — CBC
HCT: 37.2 % (ref 36.0–46.0)
Hemoglobin: 12 g/dL (ref 12.0–15.0)
MCH: 31.3 pg (ref 26.0–34.0)
MCHC: 32.3 g/dL (ref 30.0–36.0)
MCV: 96.9 fL (ref 80.0–100.0)
Platelets: 198 10*3/uL (ref 150–400)
RBC: 3.84 MIL/uL — ABNORMAL LOW (ref 3.87–5.11)
RDW: 12.7 % (ref 11.5–15.5)
WBC: 4.5 10*3/uL (ref 4.0–10.5)
nRBC: 0 % (ref 0.0–0.2)

## 2022-04-01 LAB — BASIC METABOLIC PANEL
Anion gap: 9 (ref 5–15)
BUN: 13 mg/dL (ref 8–23)
CO2: 25 mmol/L (ref 22–32)
Calcium: 9.1 mg/dL (ref 8.9–10.3)
Chloride: 107 mmol/L (ref 98–111)
Creatinine, Ser: 0.75 mg/dL (ref 0.44–1.00)
GFR, Estimated: >60 mL/min (ref >60)
Glucose, Bld: 87 mg/dL (ref 70–99)
Potassium: 3.6 mmol/L (ref 3.5–5.1)
Sodium: 141 mmol/L (ref 135–145)

## 2022-04-01 LAB — SURGICAL PCR SCREEN
MRSA, PCR: NEGATIVE
Staphylococcus aureus: NEGATIVE

## 2022-04-01 NOTE — Progress Notes (Signed)
For Short Stay: [place] appointment date:  Bowel Prep reminder:   For Anesthesia: PCP - Dr. Iona Beard Osei-Bonsu: Clearance:03/24/22: chart. Cardiologist -   Chest x-ray -  EKG -  Stress Test -  ECHO -  Cardiac Cath -  Pacemaker/ICD device last checked: Pacemaker orders received: Device Rep notified:  Spinal Cord Stimulator:  Sleep Study -  CPAP -   Fasting Blood Sugar -  Checks Blood Sugar _____ times a day Date and result of last Hgb A1c-  Last dose of GLP1 agonist-  GLP1 instructions:   Last dose of SGLT-2 inhibitors-  SGLT-2 instructions:   Blood Thinner Instructions: Aspirin Instructions: Last Dose:  Activity level: Can go up a flight of stairs and activities of daily living without stopping and without chest pain and/or shortness of breath   Able to exercise without chest pain and/or shortness of breath    Pt. Is unable to go up speps due to hip pain. Anesthesia review:   Patient denies shortness of breath, fever, cough and chest pain at PAT appointment   Patient verbalized understanding of instructions that were given to them at the PAT appointment. Patient was also instructed that they will need to review over the PAT instructions again at home before surgery.

## 2022-04-05 ENCOUNTER — Ambulatory Visit: Payer: Self-pay | Admitting: Student

## 2022-04-05 NOTE — H&P (Signed)
TOTAL HIP ADMISSION H&P  Patient is admitted for left total hip arthroplasty.  Subjective:  Chief Complaint: left hip pain  HPI: Kara Fox, 67 y.o. female, has a history of pain and functional disability in the left hip(s) due to arthritis and patient has failed non-surgical conservative treatments for greater than 12 weeks to include NSAID's and/or analgesics, flexibility and strengthening excercises, use of assistive devices, and activity modification.  Onset of symptoms was gradual starting >10 years ago with rapidlly worsening course since that time.The patient noted no past surgery on the left hip(s).  Patient currently rates pain in the left hip at 10 out of 10 with activity. Patient has night pain, worsening of pain with activity and weight bearing, trendelenberg gait, pain that interfers with activities of daily living, and pain with passive range of motion. Patient has evidence of subchondral cysts, subchondral sclerosis, periarticular osteophytes, and joint space narrowing by imaging studies. This condition presents safety issues increasing the risk of falls.  There is no current active infection.  There are no problems to display for this patient.  Past Medical History:  Diagnosis Date   Arthritis    Hepatitis    Hypertension     Past Surgical History:  Procedure Laterality Date   CESAREAN SECTION     HERNIA REPAIR      Current Outpatient Medications  Medication Sig Dispense Refill Last Dose   celecoxib (CELEBREX) 100 MG capsule Take 1 capsule (100 mg total) by mouth 2 (two) times daily. (Patient not taking: Reported on 04/01/2022) 30 capsule 0    celecoxib (CELEBREX) 100 MG capsule Take 1 capsule by mouth 2 times daily for 30 days. (Patient not taking: Reported on 04/01/2022) 60 capsule 3    celecoxib (CELEBREX) 100 MG capsule Take 1 capsule (100 mg total) by mouth 2 (two) times daily. (Patient not taking: Reported on 04/01/2022) 60 capsule 2    Cholecalciferol 25 MCG  (1000 UT) tablet Take 1 tablet by mouth once a day (Patient not taking: Reported on 04/01/2022) 30 tablet 3    Diclofenac Sodium 3 % GEL Apply 1 application topically 2 (two) times daily. (Patient not taking: Reported on 04/01/2022) 100 g 5    gabapentin (NEURONTIN) 100 MG capsule Take 1 capsule by mouth 3 times a day (Patient not taking: Reported on 04/01/2022) 90 capsule 3    gabapentin (NEURONTIN) 100 MG capsule Take 1 capsule by mouth 3 times daily for 30 days. (Patient not taking: Reported on 04/01/2022) 90 capsule 3    gabapentin (NEURONTIN) 100 MG capsule Take 1 capsule by mouth 3 times daily. (Patient not taking: Reported on 04/01/2022) 90 capsule 3    gabapentin (NEURONTIN) 100 MG capsule Take 1 capsule (100 mg total) by mouth 3 (three) times daily. (Patient not taking: Reported on 04/01/2022) 90 capsule 3    hydrochlorothiazide (HYDRODIURIL) 12.5 MG tablet Take 1 tablet (12.5 mg total) by mouth daily. (Patient not taking: Reported on 04/01/2022) 30 tablet 4    hydrochlorothiazide (HYDRODIURIL) 12.5 MG tablet Take 1 tablet by mouth daily for 30 days. (Patient not taking: Reported on 04/01/2022) 30 tablet 5    hydrochlorothiazide (HYDRODIURIL) 12.5 MG tablet Take 1 tablet by mouth daily. (Patient not taking: Reported on 04/01/2022) 30 tablet 5    hydrochlorothiazide (HYDRODIURIL) 12.5 MG tablet Take 1 tablet (12.5 mg total) by mouth daily. (Patient not taking: Reported on 04/01/2022) 30 tablet 5    methadone (DOLOPHINE) 10 MG/ML solution Take 110 mg by mouth  in the morning.      methocarbamol (ROBAXIN) 500 MG tablet Take 1 tablet (500 mg total) by mouth every 8 (eight) hours as needed for muscle spasms. (Patient not taking: Reported on 04/01/2022) 30 tablet 0    No current facility-administered medications for this visit.   No Known Allergies  Social History   Tobacco Use   Smoking status: Former    Packs/day: 0.25    Years: 35.00    Total pack years: 8.75    Types: Cigarettes    Quit date:  01/21/2022    Years since quitting: 0.2   Smokeless tobacco: Never  Substance Use Topics   Alcohol use: Not Currently    Family History  Family history unknown: Yes     Review of Systems  Musculoskeletal:  Positive for arthralgias and gait problem.  All other systems reviewed and are negative.   Objective:  Physical Exam Constitutional:      Appearance: Normal appearance.  HENT:     Head: Normocephalic and atraumatic.     Nose: Nose normal.     Mouth/Throat:     Mouth: Mucous membranes are moist.     Pharynx: Oropharynx is clear.  Eyes:     Conjunctiva/sclera: Conjunctivae normal.  Cardiovascular:     Rate and Rhythm: Normal rate and regular rhythm.     Pulses: Normal pulses.  Pulmonary:     Effort: Pulmonary effort is normal.     Breath sounds: Normal breath sounds.  Abdominal:     General: Abdomen is flat.     Palpations: Abdomen is soft.  Genitourinary:    Comments: Deferred. Musculoskeletal:     Cervical back: Normal range of motion and neck supple.     Comments: Examination of her left hip reveals no skin wounds or lesions. She has severe motion restriction with flexion contracture and pain with movement. She has no internal or external rotation in her hips bilaterally. Trochanteric tenderness to palpation bilaterally.   Sensory and motor function intact in LE bilaterally. Palpable pedal pulses.   No significant pedal edema. Calves soft and non-tender.   She presents today in a wheelchair. She has significant pain and difficulty to even transfer her to the examination table with assistance.   Skin:    General: Skin is warm and dry.     Capillary Refill: Capillary refill takes less than 2 seconds.  Neurological:     General: No focal deficit present.     Mental Status: She is alert and oriented to person, place, and time.  Psychiatric:        Mood and Affect: Mood normal.        Behavior: Behavior normal.        Thought Content: Thought content normal.         Judgment: Judgment normal.     Vital signs in last 24 hours: @VSRANGES @  Labs:   Estimated body mass index is 24.37 kg/m as calculated from the following:   Height as of 04/01/22: 5\' 1"  (1.549 m).   Weight as of 04/01/22: 58.5 kg.   Imaging Review Plain radiographs demonstrate severe degenerative joint disease of the left hip(s). The bone quality appears to be adequate for age and reported activity level.      Assessment/Plan:  End stage arthritis, left hip(s)  The patient history, physical examination, clinical judgement of the provider and imaging studies are consistent with end stage degenerative joint disease of the left hip(s) and total hip arthroplasty is  deemed medically necessary. The treatment options including medical management, injection therapy, arthroscopy and arthroplasty were discussed at length. The risks and benefits of total hip arthroplasty were presented and reviewed. The risks due to aseptic loosening, infection, stiffness, dislocation/subluxation,  thromboembolic complications and other imponderables were discussed.  The patient acknowledged the explanation, agreed to proceed with the plan and consent was signed. Patient is being admitted for inpatient treatment for surgery, pain control, PT, OT, prophylactic antibiotics, VTE prophylaxis, progressive ambulation and ADL's and discharge planning.The patient is planning to be discharged home after an overnight stay.   Therapy Plans: HEP. Disposition: Home with daugheter Planned DVT Prophylaxis: aspirin 81mg  BID DME needed: walker PCP: Cleared.  TXA: IV Allergies: NDKA. Anesthesia Concerns: None.  BMI: 24.9 Last HgbA1c: Not diabetic Other: - History of substance abuse, on methadone. Note provided today for her to take to pain clinic. We discussed we would take over her pain management for 2 weeks postoperatively then they would take back over.  - She reports she has quit smoking - Hydrocodone, zofran,  methocarbamol.  - 04/01/22: Hgb 12.0, K+ 3.6, Cr. 0.75.     Patient's anticipated LOS is less than 2 midnights, meeting these requirements: - Younger than 65 - Lives within 1 hour of care - Has a competent adult at home to recover with post-op recover - NO history of  - Chronic pain requiring opiods  - Diabetes  - Coronary Artery Disease  - Heart failure  - Heart attack  - Stroke  - DVT/VTE  - Cardiac arrhythmia  - Respiratory Failure/COPD  - Renal failure  - Anemia  - Advanced Liver disease

## 2022-04-05 NOTE — H&P (View-Only) (Signed)
TOTAL HIP ADMISSION H&P  Patient is admitted for left total hip arthroplasty.  Subjective:  Chief Complaint: left hip pain  HPI: Kara Fox, 67 y.o. female, has a history of pain and functional disability in the left hip(s) due to arthritis and patient has failed non-surgical conservative treatments for greater than 12 weeks to include NSAID's and/or analgesics, flexibility and strengthening excercises, use of assistive devices, and activity modification.  Onset of symptoms was gradual starting >10 years ago with rapidlly worsening course since that time.The patient noted no past surgery on the left hip(s).  Patient currently rates pain in the left hip at 10 out of 10 with activity. Patient has night pain, worsening of pain with activity and weight bearing, trendelenberg gait, pain that interfers with activities of daily living, and pain with passive range of motion. Patient has evidence of subchondral cysts, subchondral sclerosis, periarticular osteophytes, and joint space narrowing by imaging studies. This condition presents safety issues increasing the risk of falls.  There is no current active infection.  There are no problems to display for this patient.  Past Medical History:  Diagnosis Date   Arthritis    Hepatitis    Hypertension     Past Surgical History:  Procedure Laterality Date   CESAREAN SECTION     HERNIA REPAIR      Current Outpatient Medications  Medication Sig Dispense Refill Last Dose   celecoxib (CELEBREX) 100 MG capsule Take 1 capsule (100 mg total) by mouth 2 (two) times daily. (Patient not taking: Reported on 04/01/2022) 30 capsule 0    celecoxib (CELEBREX) 100 MG capsule Take 1 capsule by mouth 2 times daily for 30 days. (Patient not taking: Reported on 04/01/2022) 60 capsule 3    celecoxib (CELEBREX) 100 MG capsule Take 1 capsule (100 mg total) by mouth 2 (two) times daily. (Patient not taking: Reported on 04/01/2022) 60 capsule 2    Cholecalciferol 25 MCG  (1000 UT) tablet Take 1 tablet by mouth once a day (Patient not taking: Reported on 04/01/2022) 30 tablet 3    Diclofenac Sodium 3 % GEL Apply 1 application topically 2 (two) times daily. (Patient not taking: Reported on 04/01/2022) 100 g 5    gabapentin (NEURONTIN) 100 MG capsule Take 1 capsule by mouth 3 times a day (Patient not taking: Reported on 04/01/2022) 90 capsule 3    gabapentin (NEURONTIN) 100 MG capsule Take 1 capsule by mouth 3 times daily for 30 days. (Patient not taking: Reported on 04/01/2022) 90 capsule 3    gabapentin (NEURONTIN) 100 MG capsule Take 1 capsule by mouth 3 times daily. (Patient not taking: Reported on 04/01/2022) 90 capsule 3    gabapentin (NEURONTIN) 100 MG capsule Take 1 capsule (100 mg total) by mouth 3 (three) times daily. (Patient not taking: Reported on 04/01/2022) 90 capsule 3    hydrochlorothiazide (HYDRODIURIL) 12.5 MG tablet Take 1 tablet (12.5 mg total) by mouth daily. (Patient not taking: Reported on 04/01/2022) 30 tablet 4    hydrochlorothiazide (HYDRODIURIL) 12.5 MG tablet Take 1 tablet by mouth daily for 30 days. (Patient not taking: Reported on 04/01/2022) 30 tablet 5    hydrochlorothiazide (HYDRODIURIL) 12.5 MG tablet Take 1 tablet by mouth daily. (Patient not taking: Reported on 04/01/2022) 30 tablet 5    hydrochlorothiazide (HYDRODIURIL) 12.5 MG tablet Take 1 tablet (12.5 mg total) by mouth daily. (Patient not taking: Reported on 04/01/2022) 30 tablet 5    methadone (DOLOPHINE) 10 MG/ML solution Take 110 mg by mouth  in the morning.      methocarbamol (ROBAXIN) 500 MG tablet Take 1 tablet (500 mg total) by mouth every 8 (eight) hours as needed for muscle spasms. (Patient not taking: Reported on 04/01/2022) 30 tablet 0    No current facility-administered medications for this visit.   No Known Allergies  Social History   Tobacco Use   Smoking status: Former    Packs/day: 0.25    Years: 35.00    Total pack years: 8.75    Types: Cigarettes    Quit date:  01/21/2022    Years since quitting: 0.2   Smokeless tobacco: Never  Substance Use Topics   Alcohol use: Not Currently    Family History  Family history unknown: Yes     Review of Systems  Musculoskeletal:  Positive for arthralgias and gait problem.  All other systems reviewed and are negative.   Objective:  Physical Exam Constitutional:      Appearance: Normal appearance.  HENT:     Head: Normocephalic and atraumatic.     Nose: Nose normal.     Mouth/Throat:     Mouth: Mucous membranes are moist.     Pharynx: Oropharynx is clear.  Eyes:     Conjunctiva/sclera: Conjunctivae normal.  Cardiovascular:     Rate and Rhythm: Normal rate and regular rhythm.     Pulses: Normal pulses.  Pulmonary:     Effort: Pulmonary effort is normal.     Breath sounds: Normal breath sounds.  Abdominal:     General: Abdomen is flat.     Palpations: Abdomen is soft.  Genitourinary:    Comments: Deferred. Musculoskeletal:     Cervical back: Normal range of motion and neck supple.     Comments: Examination of her left hip reveals no skin wounds or lesions. She has severe motion restriction with flexion contracture and pain with movement. She has no internal or external rotation in her hips bilaterally. Trochanteric tenderness to palpation bilaterally.   Sensory and motor function intact in LE bilaterally. Palpable pedal pulses.   No significant pedal edema. Calves soft and non-tender.   She presents today in a wheelchair. She has significant pain and difficulty to even transfer her to the examination table with assistance.   Skin:    General: Skin is warm and dry.     Capillary Refill: Capillary refill takes less than 2 seconds.  Neurological:     General: No focal deficit present.     Mental Status: She is alert and oriented to person, place, and time.  Psychiatric:        Mood and Affect: Mood normal.        Behavior: Behavior normal.        Thought Content: Thought content normal.         Judgment: Judgment normal.     Vital signs in last 24 hours: @VSRANGES@  Labs:   Estimated body mass index is 24.37 kg/m as calculated from the following:   Height as of 04/01/22: 5' 1" (1.549 m).   Weight as of 04/01/22: 58.5 kg.   Imaging Review Plain radiographs demonstrate severe degenerative joint disease of the left hip(s). The bone quality appears to be adequate for age and reported activity level.      Assessment/Plan:  End stage arthritis, left hip(s)  The patient history, physical examination, clinical judgement of the provider and imaging studies are consistent with end stage degenerative joint disease of the left hip(s) and total hip arthroplasty is   deemed medically necessary. The treatment options including medical management, injection therapy, arthroscopy and arthroplasty were discussed at length. The risks and benefits of total hip arthroplasty were presented and reviewed. The risks due to aseptic loosening, infection, stiffness, dislocation/subluxation,  thromboembolic complications and other imponderables were discussed.  The patient acknowledged the explanation, agreed to proceed with the plan and consent was signed. Patient is being admitted for inpatient treatment for surgery, pain control, PT, OT, prophylactic antibiotics, VTE prophylaxis, progressive ambulation and ADL's and discharge planning.The patient is planning to be discharged home after an overnight stay.   Therapy Plans: HEP. Disposition: Home with daugheter Planned DVT Prophylaxis: aspirin 81mg  BID DME needed: walker PCP: Cleared.  TXA: IV Allergies: NDKA. Anesthesia Concerns: None.  BMI: 24.9 Last HgbA1c: Not diabetic Other: - History of substance abuse, on methadone. Note provided today for her to take to pain clinic. We discussed we would take over her pain management for 2 weeks postoperatively then they would take back over.  - She reports she has quit smoking - Hydrocodone, zofran,  methocarbamol.  - 04/01/22: Hgb 12.0, K+ 3.6, Cr. 0.75.     Patient's anticipated LOS is less than 2 midnights, meeting these requirements: - Younger than 65 - Lives within 1 hour of care - Has a competent adult at home to recover with post-op recover - NO history of  - Chronic pain requiring opiods  - Diabetes  - Coronary Artery Disease  - Heart failure  - Heart attack  - Stroke  - DVT/VTE  - Cardiac arrhythmia  - Respiratory Failure/COPD  - Renal failure  - Anemia  - Advanced Liver disease

## 2022-04-09 ENCOUNTER — Other Ambulatory Visit: Payer: Self-pay

## 2022-04-09 ENCOUNTER — Ambulatory Visit (HOSPITAL_COMMUNITY): Payer: Medicaid Other

## 2022-04-09 ENCOUNTER — Ambulatory Visit (HOSPITAL_COMMUNITY)
Admission: RE | Admit: 2022-04-09 | Discharge: 2022-04-10 | Disposition: A | Discharge status: Home / Self Care | Payer: Medicaid Other | Source: Ambulatory Visit | Attending: Orthopedic Surgery | Admitting: Orthopedic Surgery

## 2022-04-09 ENCOUNTER — Encounter (HOSPITAL_COMMUNITY): Payer: Self-pay | Admitting: Orthopedic Surgery

## 2022-04-09 ENCOUNTER — Ambulatory Visit (HOSPITAL_BASED_OUTPATIENT_CLINIC_OR_DEPARTMENT_OTHER): Payer: Medicaid Other

## 2022-04-09 ENCOUNTER — Encounter (HOSPITAL_COMMUNITY)
Admission: RE | Disposition: A | Discharge status: Home / Self Care | Payer: Self-pay | Source: Ambulatory Visit | Attending: Orthopedic Surgery

## 2022-04-09 DIAGNOSIS — Z79899 Other long term (current) drug therapy: Secondary | ICD-10-CM | POA: Insufficient documentation

## 2022-04-09 DIAGNOSIS — I10 Essential (primary) hypertension: Secondary | ICD-10-CM | POA: Diagnosis not present

## 2022-04-09 DIAGNOSIS — M1612 Unilateral primary osteoarthritis, left hip: Secondary | ICD-10-CM | POA: Diagnosis present

## 2022-04-09 DIAGNOSIS — Z96642 Presence of left artificial hip joint: Secondary | ICD-10-CM

## 2022-04-09 DIAGNOSIS — Z87891 Personal history of nicotine dependence: Secondary | ICD-10-CM | POA: Diagnosis not present

## 2022-04-09 HISTORY — PX: TOTAL HIP ARTHROPLASTY: SHX124

## 2022-04-09 LAB — TYPE AND SCREEN
ABO/RH(D): O POS
Antibody Screen: NEGATIVE

## 2022-04-09 LAB — ABO/RH: ABO/RH(D): O POS

## 2022-04-09 SURGERY — ARTHROPLASTY, HIP, TOTAL, ANTERIOR APPROACH
Anesthesia: Spinal | Site: Hip | Laterality: Left

## 2022-04-09 MED ORDER — MIDAZOLAM HCL 5 MG/5ML IJ SOLN
INTRAMUSCULAR | Status: DC | PRN
  Administered 2022-04-09: 2 mg via INTRAVENOUS

## 2022-04-09 MED ORDER — ACETAMINOPHEN 500 MG PO TABS
1000.0000 mg | ORAL_TABLET | Freq: Once | ORAL | Status: AC
  Administered 2022-04-09: 1000 mg via ORAL
  Filled 2022-04-09: qty 2

## 2022-04-09 MED ORDER — CHLORHEXIDINE GLUCONATE 0.12 % MT SOLN
15.0000 mL | Freq: Once | OROMUCOSAL | Status: AC
  Administered 2022-04-09: 15 mL via OROMUCOSAL

## 2022-04-09 MED ORDER — SENNA 8.6 MG PO TABS
1.0000 | ORAL_TABLET | Freq: Two times a day (BID) | ORAL | Status: DC
  Administered 2022-04-09 – 2022-04-10 (×2): 8.6 mg via ORAL
  Filled 2022-04-09 (×2): qty 1

## 2022-04-09 MED ORDER — BUPIVACAINE HCL (PF) 0.25 % IJ SOLN
INTRAMUSCULAR | Status: AC
  Filled 2022-04-09: qty 30

## 2022-04-09 MED ORDER — POVIDONE-IODINE 10 % EX SWAB: 2.0000 | Freq: Once | CUTANEOUS | Status: AC

## 2022-04-09 MED ORDER — MENTHOL 3 MG MT LOZG: 1.0000 | LOZENGE | OROMUCOSAL | Status: DC | PRN

## 2022-04-09 MED ORDER — KETOROLAC TROMETHAMINE 30 MG/ML IJ SOLN
INTRAMUSCULAR | Status: AC
  Filled 2022-04-09: qty 1

## 2022-04-09 MED ORDER — ASPIRIN 81 MG PO CHEW
81.0000 mg | CHEWABLE_TABLET | Freq: Two times a day (BID) | ORAL | Status: DC
  Administered 2022-04-09 – 2022-04-10 (×2): 81 mg via ORAL
  Filled 2022-04-09 (×2): qty 1

## 2022-04-09 MED ORDER — DIPHENHYDRAMINE HCL 12.5 MG/5ML PO ELIX: 12.5000 mg | ORAL_SOLUTION | ORAL | Status: DC | PRN

## 2022-04-09 MED ORDER — TRANEXAMIC ACID-NACL 1000-0.7 MG/100ML-% IV SOLN
1000.0000 mg | INTRAVENOUS | Status: AC
  Administered 2022-04-09: 1000 mg via INTRAVENOUS
  Filled 2022-04-09: qty 100

## 2022-04-09 MED ORDER — ACETAMINOPHEN 325 MG PO TABS: 325.0000 mg | ORAL_TABLET | Freq: Four times a day (QID) | ORAL | Status: DC | PRN

## 2022-04-09 MED ORDER — WATER FOR IRRIGATION, STERILE IR SOLN
Status: DC | PRN
  Administered 2022-04-09: 2000 mL

## 2022-04-09 MED ORDER — DEXAMETHASONE SODIUM PHOSPHATE 10 MG/ML IJ SOLN
INTRAMUSCULAR | Status: DC | PRN
  Administered 2022-04-09: 10 mg via INTRAVENOUS

## 2022-04-09 MED ORDER — METHOCARBAMOL 500 MG IVPB - SIMPLE MED: 500.0000 mg | Freq: Four times a day (QID) | INTRAVENOUS | Status: DC | PRN

## 2022-04-09 MED ORDER — KETOROLAC TROMETHAMINE 30 MG/ML IJ SOLN
INTRAMUSCULAR | Status: DC | PRN
  Administered 2022-04-09: 30 mg via INTRAVENOUS

## 2022-04-09 MED ORDER — FENTANYL CITRATE (PF) 100 MCG/2ML IJ SOLN
INTRAMUSCULAR | Status: AC
  Filled 2022-04-09: qty 2

## 2022-04-09 MED ORDER — MORPHINE SULFATE (PF) 2 MG/ML IV SOLN: 0.5000 mg | INTRAVENOUS | Status: DC | PRN

## 2022-04-09 MED ORDER — METOCLOPRAMIDE HCL 5 MG PO TABS: 5.0000 mg | ORAL_TABLET | Freq: Three times a day (TID) | ORAL | Status: DC | PRN

## 2022-04-09 MED ORDER — CELECOXIB 200 MG PO CAPS
200.0000 mg | ORAL_CAPSULE | Freq: Two times a day (BID) | ORAL | Status: DC
  Administered 2022-04-09 – 2022-04-10 (×2): 200 mg via ORAL
  Filled 2022-04-09 (×2): qty 1

## 2022-04-09 MED ORDER — METHADONE HCL 10 MG PO TABS
110.0000 mg | ORAL_TABLET | Freq: Every morning | ORAL | Status: DC
  Administered 2022-04-09 – 2022-04-10 (×2): 110 mg via ORAL
  Filled 2022-04-09 (×2): qty 11

## 2022-04-09 MED ORDER — POVIDONE-IODINE 10 % EX SWAB
2.0000 | Freq: Once | CUTANEOUS | Status: AC
  Administered 2022-04-09: 2 via TOPICAL

## 2022-04-09 MED ORDER — ONDANSETRON HCL 4 MG/2ML IJ SOLN
INTRAMUSCULAR | Status: DC | PRN
  Administered 2022-04-09: 4 mg via INTRAVENOUS

## 2022-04-09 MED ORDER — LACTATED RINGERS IV SOLN: INTRAVENOUS | Status: DC

## 2022-04-09 MED ORDER — SODIUM CHLORIDE 0.9 % IV SOLN: INTRAVENOUS | Status: DC

## 2022-04-09 MED ORDER — SODIUM CHLORIDE (PF) 0.9 % IJ SOLN
INTRAMUSCULAR | Status: DC | PRN
  Administered 2022-04-09: 30 mL

## 2022-04-09 MED ORDER — FENTANYL CITRATE (PF) 100 MCG/2ML IJ SOLN
INTRAMUSCULAR | Status: DC | PRN
  Administered 2022-04-09 (×2): 50 ug via INTRAVENOUS

## 2022-04-09 MED ORDER — BUPIVACAINE IN DEXTROSE 0.75-8.25 % IT SOLN
INTRATHECAL | Status: DC | PRN
  Administered 2022-04-09: 1.6 mL via INTRATHECAL

## 2022-04-09 MED ORDER — FENTANYL CITRATE PF 50 MCG/ML IJ SOSY: 25.0000 ug | PREFILLED_SYRINGE | INTRAMUSCULAR | Status: DC | PRN

## 2022-04-09 MED ORDER — ISOPROPYL ALCOHOL 70 % SOLN
Status: DC | PRN
  Administered 2022-04-09: 1 via TOPICAL

## 2022-04-09 MED ORDER — PHENOL 1.4 % MT LIQD: 1.0000 | OROMUCOSAL | Status: DC | PRN

## 2022-04-09 MED ORDER — HYDROCODONE-ACETAMINOPHEN 7.5-325 MG PO TABS
1.0000 | ORAL_TABLET | ORAL | Status: DC | PRN
  Administered 2022-04-10 (×3): 2 via ORAL
  Filled 2022-04-09 (×3): qty 2

## 2022-04-09 MED ORDER — CEFAZOLIN SODIUM-DEXTROSE 2-4 GM/100ML-% IV SOLN
2.0000 g | Freq: Four times a day (QID) | INTRAVENOUS | Status: AC
  Administered 2022-04-09 – 2022-04-10 (×2): 2 g via INTRAVENOUS
  Filled 2022-04-09 (×2): qty 100

## 2022-04-09 MED ORDER — ORAL CARE MOUTH RINSE: 15.0000 mL | Freq: Once | OROMUCOSAL | Status: AC

## 2022-04-09 MED ORDER — PHENYLEPHRINE HCL-NACL 20-0.9 MG/250ML-% IV SOLN
INTRAVENOUS | Status: DC | PRN
  Administered 2022-04-09: 25 ug/min via INTRAVENOUS

## 2022-04-09 MED ORDER — CEFAZOLIN SODIUM-DEXTROSE 2-4 GM/100ML-% IV SOLN
2.0000 g | INTRAVENOUS | Status: AC
  Administered 2022-04-09: 2 g via INTRAVENOUS
  Filled 2022-04-09: qty 100

## 2022-04-09 MED ORDER — METHOCARBAMOL 500 MG PO TABS
500.0000 mg | ORAL_TABLET | Freq: Four times a day (QID) | ORAL | Status: DC | PRN
  Administered 2022-04-09: 500 mg via ORAL
  Filled 2022-04-09: qty 1

## 2022-04-09 MED ORDER — MIDAZOLAM HCL 2 MG/2ML IJ SOLN
INTRAMUSCULAR | Status: AC
  Filled 2022-04-09: qty 2

## 2022-04-09 MED ORDER — SODIUM CHLORIDE (PF) 0.9 % IJ SOLN
INTRAMUSCULAR | Status: AC
  Filled 2022-04-09: qty 30

## 2022-04-09 MED ORDER — ALUM & MAG HYDROXIDE-SIMETH 200-200-20 MG/5ML PO SUSP: 30.0000 mL | ORAL | Status: DC | PRN

## 2022-04-09 MED ORDER — METOCLOPRAMIDE HCL 5 MG/ML IJ SOLN: 5.0000 mg | Freq: Three times a day (TID) | INTRAMUSCULAR | Status: DC | PRN

## 2022-04-09 MED ORDER — ONDANSETRON HCL 4 MG PO TABS: 4.0000 mg | ORAL_TABLET | Freq: Four times a day (QID) | ORAL | Status: DC | PRN

## 2022-04-09 MED ORDER — SODIUM CHLORIDE 0.9 % IR SOLN
Status: DC | PRN
  Administered 2022-04-09: 1000 mL

## 2022-04-09 MED ORDER — DOCUSATE SODIUM 100 MG PO CAPS
100.0000 mg | ORAL_CAPSULE | Freq: Two times a day (BID) | ORAL | Status: DC
  Administered 2022-04-09 – 2022-04-10 (×2): 100 mg via ORAL
  Filled 2022-04-09 (×2): qty 1

## 2022-04-09 MED ORDER — ONDANSETRON HCL 4 MG/2ML IJ SOLN: 4.0000 mg | Freq: Four times a day (QID) | INTRAMUSCULAR | Status: DC | PRN

## 2022-04-09 MED ORDER — HYDROCODONE-ACETAMINOPHEN 5-325 MG PO TABS
1.0000 | ORAL_TABLET | ORAL | Status: DC | PRN
  Administered 2022-04-09: 2 via ORAL
  Filled 2022-04-09: qty 2

## 2022-04-09 MED ORDER — PROPOFOL 500 MG/50ML IV EMUL
INTRAVENOUS | Status: DC | PRN
  Administered 2022-04-09: 75 ug/kg/min via INTRAVENOUS

## 2022-04-09 MED ORDER — POLYETHYLENE GLYCOL 3350 17 G PO PACK: 17.0000 g | PACK | Freq: Every day | ORAL | Status: DC | PRN

## 2022-04-09 MED ORDER — PROPOFOL 10 MG/ML IV BOLUS
INTRAVENOUS | Status: DC | PRN
  Administered 2022-04-09: 30 mg via INTRAVENOUS

## 2022-04-09 SURGICAL SUPPLY — 53 items
ACE SHELL 3H 52 E HIP (Shell) ×1 IMPLANT
BAG COUNTER SPONGE SURGICOUNT (BAG) IMPLANT
BAG DECANTER FOR FLEXI CONT (MISCELLANEOUS) IMPLANT
BAG ZIPLOCK 12X15 (MISCELLANEOUS) IMPLANT
CHLORAPREP W/TINT 26 (MISCELLANEOUS) ×1 IMPLANT
COVER PERINEAL POST (MISCELLANEOUS) ×1 IMPLANT
COVER SURGICAL LIGHT HANDLE (MISCELLANEOUS) ×1 IMPLANT
DERMABOND ADVANCED .7 DNX12 (GAUZE/BANDAGES/DRESSINGS) ×2 IMPLANT
DRAPE IMP U-DRAPE 54X76 (DRAPES) ×1 IMPLANT
DRAPE SHEET LG 3/4 BI-LAMINATE (DRAPES) ×3 IMPLANT
DRAPE STERI IOBAN 125X83 (DRAPES) ×1 IMPLANT
DRAPE U-SHAPE 47X51 STRL (DRAPES) ×2 IMPLANT
DRSG AQUACEL AG ADV 3.5X10 (GAUZE/BANDAGES/DRESSINGS) ×1 IMPLANT
ELECT REM PT RETURN 15FT ADLT (MISCELLANEOUS) ×1 IMPLANT
GAUZE SPONGE 4X4 12PLY STRL (GAUZE/BANDAGES/DRESSINGS) ×1 IMPLANT
GLOVE BIO SURGEON STRL SZ8.5 (GLOVE) ×2 IMPLANT
GLOVE BIOGEL M 7.0 STRL (GLOVE) ×1 IMPLANT
GLOVE BIOGEL PI IND STRL 7.5 (GLOVE) ×1 IMPLANT
GLOVE BIOGEL PI IND STRL 8.5 (GLOVE) ×1 IMPLANT
GOWN SPEC L3 XXLG W/TWL (GOWN DISPOSABLE) ×1 IMPLANT
GOWN STRL REUS W/ TWL XL LVL3 (GOWN DISPOSABLE) ×1 IMPLANT
GOWN STRL REUS W/TWL XL LVL3 (GOWN DISPOSABLE) ×1
HANDPIECE INTERPULSE COAX TIP (DISPOSABLE) ×1
HEAD CERAMIC BIOLOX 36 (Head) IMPLANT
HOLDER FOLEY CATH W/STRAP (MISCELLANEOUS) ×1 IMPLANT
HOOD PEEL AWAY T7 (MISCELLANEOUS) ×3 IMPLANT
KIT TURNOVER KIT A (KITS) IMPLANT
LINER ACE G7 HIGH 36 SZ E (Liner) IMPLANT
MANIFOLD NEPTUNE II (INSTRUMENTS) ×1 IMPLANT
MARKER SKIN DUAL TIP RULER LAB (MISCELLANEOUS) ×1 IMPLANT
NDL SAFETY ECLIP 18X1.5 (MISCELLANEOUS) ×1 IMPLANT
NDL SPNL 18GX3.5 QUINCKE PK (NEEDLE) ×1 IMPLANT
NEEDLE SPNL 18GX3.5 QUINCKE PK (NEEDLE) ×1 IMPLANT
PACK ANTERIOR HIP CUSTOM (KITS) ×1 IMPLANT
PENCIL SMOKE EVACUATOR (MISCELLANEOUS) IMPLANT
SAW OSC TIP CART 19.5X105X1.3 (SAW) ×1 IMPLANT
SEALER BIPOLAR AQUA 6.0 (INSTRUMENTS) ×1 IMPLANT
SET HNDPC FAN SPRY TIP SCT (DISPOSABLE) ×1 IMPLANT
SHELL ACETAB 3H 52 E HIP (Shell) IMPLANT
SOLUTION PRONTOSAN WOUND 350ML (IRRIGATION / IRRIGATOR) ×1 IMPLANT
SPIKE FLUID TRANSFER (MISCELLANEOUS) ×1 IMPLANT
STEM FEM CMTLS TL HIP 5X95 (Stem) IMPLANT
SUT MNCRL AB 3-0 PS2 18 (SUTURE) ×1 IMPLANT
SUT MON AB 2-0 CT1 36 (SUTURE) ×1 IMPLANT
SUT STRATAFIX PDO 1 14 VIOLET (SUTURE) ×1
SUT STRATFX PDO 1 14 VIOLET (SUTURE) ×1
SUT VIC AB 2-0 CT1 27 (SUTURE)
SUT VIC AB 2-0 CT1 TAPERPNT 27 (SUTURE) IMPLANT
SUTURE STRATFX PDO 1 14 VIOLET (SUTURE) ×1 IMPLANT
SYR 3ML LL SCALE MARK (SYRINGE) ×1 IMPLANT
TRAY FOLEY MTR SLVR 16FR STAT (SET/KITS/TRAYS/PACK) IMPLANT
TUBE SUCTION HIGH CAP CLEAR NV (SUCTIONS) ×1 IMPLANT
WATER STERILE IRR 1000ML POUR (IV SOLUTION) ×1 IMPLANT

## 2022-04-09 NOTE — Transfer of Care (Signed)
Immediate Anesthesia Transfer of Care Note  Patient: Kara Fox  Procedure(s) Performed: TOTAL HIP ARTHROPLASTY ANTERIOR APPROACH (Left: Hip)  Patient Location: PACU  Anesthesia Type:MAC  Level of Consciousness: awake, alert , and oriented  Airway & Oxygen Therapy: Patient Spontanous Breathing and Patient connected to face mask oxygen  Post-op Assessment: Report given to RN and Post -op Vital signs reviewed and stable  Post vital signs: Reviewed and stable  Last Vitals:  Vitals Value Taken Time  BP 88/60   Temp    Pulse 55 04/09/22 1529  Resp 11 04/09/22 1529  SpO2 100 % 04/09/22 1529  Vitals shown include unvalidated device data.  Last Pain:  Vitals:   04/09/22 1133  TempSrc:   PainSc: 7       Patients Stated Pain Goal: 5 (02/54/27 0623)  Complications: No notable events documented.

## 2022-04-09 NOTE — Anesthesia Procedure Notes (Signed)
Spinal  Patient location during procedure: OR End time: 04/09/2022 1:15 PM Reason for block: surgical anesthesia Staffing Performed: resident/CRNA  Resident/CRNA: Noralyn Pick D, CRNA Performed by: Whitman Meinhardt, Clinical cytogeneticist D, CRNA Authorized by: Roderic Palau, MD   Preanesthetic Checklist Completed: patient identified, IV checked, site marked, risks and benefits discussed, surgical consent, monitors and equipment checked, pre-op evaluation and timeout performed Spinal Block Patient position: sitting Prep: DuraPrep Patient monitoring: heart rate, continuous pulse ox and blood pressure Approach: midline Location: L3-4 Injection technique: single-shot Needle Needle type: Pencan  Needle gauge: 24 G Needle length: 9 cm Assessment Sensory level: T6 Events: CSF return

## 2022-04-09 NOTE — Op Note (Signed)
OPERATIVE REPORT  SURGEON: Rod Can, MD   ASSISTANT: Larene Pickett, PA-C.  PREOPERATIVE DIAGNOSIS: Left hip arthritis.   POSTOPERATIVE DIAGNOSIS: Left hip arthritis.   PROCEDURE: Left total hip arthroplasty, anterior approach.   IMPLANTS: Biomet Taperloc Complete Microplasty stem, size 5 x 95 mm, standard offset. Biomet G7 OsseoTi Cup, size 52 mm. Biomet Vivacit-E liner, size 36 mm, E, neutral. Biomet Biolox ceramic head ball, size 36 - 3 mm.  ANESTHESIA:  MAC and Spinal  ESTIMATED BLOOD LOSS:-400 mL    ANTIBIOTICS: 2g Ancef.  DRAINS: None.  COMPLICATIONS: None.   CONDITION: PACU - hemodynamically stable.   BRIEF CLINICAL NOTE: Kara Fox is a 67 y.o. female with a long-standing history of Left hip arthritis. After failing conservative management, the patient was indicated for total hip arthroplasty. The risks, benefits, and alternatives to the procedure were explained, and the patient elected to proceed.  PROCEDURE IN DETAIL: Surgical site was marked by myself in the pre-op holding area. Once inside the operating room, spinal anesthesia was obtained, and a foley catheter was inserted. The patient was then positioned on the Hana table.  All bony prominences were well padded.  The hip was prepped and draped in the normal sterile surgical fashion.  A time-out was called verifying side and site of surgery. The patient received IV antibiotics within 60 minutes of beginning the procedure.   Bikini incision was made, and superficial dissection was performed lateral to the ASIS. The direct anterior approach to the hip was performed through the Hueter interval.  Lateral femoral circumflex vessels were treated with the Auqumantys. The anterior capsule was exposed and an inverted T capsulotomy was made. The femoral neck cut was made to the level of the templated cut.  A corkscrew was placed into the head and the head was removed.  The femoral head was found to have eburnated bone.  The head was passed to the back table and was measured. Pubofemoral ligament was released off of the calcar, taking care to stay on bone. Superior capsule was released from the greater trochanter, taking care to stay lateral to the posterior border of the femoral neck in order to preserve the short external rotators.   Acetabular exposure was achieved, and the pulvinar and labrum were excised. Sequential reaming of the acetabulum was then performed up to a size 51 mm reamer. A 52 mm cup was then opened and impacted into place at approximately 40 degrees of abduction and 20 degrees of anteversion. The final polyethylene liner was impacted into place and acetabular osteophytes were removed.    I then gained femoral exposure taking care to protect the abductors and greater trochanter.  This was performed using standard external rotation, extension, and adduction.  A cookie cutter was used to enter the femoral canal, and then the femoral canal finder was placed.  Sequential broaching was performed up to a size 5.  Calcar planer was used on the femoral neck remnant.  I placed a standard offset neck and a trial head ball.  The hip was reduced.  Leg lengths and offset were checked fluoroscopically.  The hip was dislocated and trial components were removed.  The final implants were placed, and the hip was reduced.  Fluoroscopy was used to confirm component position and leg lengths.  At 90 degrees of external rotation and full extension, the hip was stable to an anterior directed force.   The wound was copiously irrigated with Prontosan solution and normal saline using pulse lavage.  Marcaine solution was injected into the periarticular soft tissue.  The wound was closed in layers using #1 Vicryl and V-Loc for the fascia, 2-0 Vicryl for the subcutaneous fat, 2-0 Monocryl for the deep dermal layer, 3-0 running Monocryl subcuticular stitch, and Dermabond for the skin.  Once the glue was fully dried, an Aquacell Ag  dressing was applied.  The patient was transported to the recovery room in stable condition.  Sponge, needle, and instrument counts were correct at the end of the case x2.  The patient tolerated the procedure well and there were no known complications.  Please note that a surgical assistant was a medical necessity for this procedure to perform it in a safe and expeditious manner. Assistant was necessary to provide appropriate retraction of vital neurovascular structures, to prevent femoral fracture, and to allow for anatomic placement of the prosthesis.

## 2022-04-09 NOTE — Interval H&P Note (Signed)
History and Physical Interval Note:  04/09/2022 10:41 AM  Kara Fox  has presented today for surgery, with the diagnosis of Left hip osteoarthritis, protrusio deformity.  The various methods of treatment have been discussed with the patient and family. After consideration of risks, benefits and other options for treatment, the patient has consented to  Procedure(s) with comments: McGill (Left) - 125 as a surgical intervention.  The patient's history has been reviewed, patient examined, no change in status, stable for surgery.  I have reviewed the patient's chart and labs.  Questions were answered to the patient's satisfaction.     Hilton Cork Tripton Ned

## 2022-04-09 NOTE — Anesthesia Postprocedure Evaluation (Signed)
Anesthesia Post Note  Patient: Kara Fox  Procedure(s) Performed: TOTAL HIP ARTHROPLASTY ANTERIOR APPROACH (Left: Hip)     Patient location during evaluation: PACU Anesthesia Type: Spinal Level of consciousness: oriented and awake and alert Pain management: pain level controlled Vital Signs Assessment: post-procedure vital signs reviewed and stable Respiratory status: spontaneous breathing and respiratory function stable Cardiovascular status: blood pressure returned to baseline and stable Postop Assessment: no headache, no backache, no apparent nausea or vomiting, spinal receding and patient able to bend at knees Anesthetic complications: no  No notable events documented.  Last Vitals:  Vitals:   04/09/22 1700 04/09/22 1715  BP: 125/81 120/78  Pulse: (!) 53 (!) 52  Resp: 16   Temp:  36.6 C  SpO2: 100% 100%    Last Pain:  Vitals:   04/09/22 1715  TempSrc: Oral  PainSc:                  Vlad Mayberry,W. EDMOND

## 2022-04-09 NOTE — Anesthesia Preprocedure Evaluation (Addendum)
Anesthesia Evaluation  Patient identified by MRN, date of birth, ID band Patient awake    Reviewed: Allergy & Precautions, NPO status , Patient's Chart, lab work & pertinent test results  Airway Mallampati: I  TM Distance: >3 FB Neck ROM: Full    Dental  (+) Partial Lower, Partial Upper, Dental Advisory Given   Pulmonary former smoker   breath sounds clear to auscultation       Cardiovascular hypertension, Pt. on medications  Rhythm:Regular Rate:Normal     Neuro/Psych negative neurological ROS  negative psych ROS   GI/Hepatic negative GI ROS,,,(+) Hepatitis -  Endo/Other  negative endocrine ROS    Renal/GU negative Renal ROS  negative genitourinary   Musculoskeletal  (+) Arthritis ,  narcotic dependent  Abdominal   Peds  Hematology negative hematology ROS (+)   Anesthesia Other Findings   Reproductive/Obstetrics                             Anesthesia Physical Anesthesia Plan  ASA: 2  Anesthesia Plan: Spinal   Post-op Pain Management: Tylenol PO (pre-op)*   Induction:   PONV Risk Score and Plan: 2 and Treatment may vary due to age or medical condition, Midazolam, Propofol infusion, Ondansetron and Dexamethasone  Airway Management Planned: Natural Airway  Additional Equipment:   Intra-op Plan:   Post-operative Plan:   Informed Consent: I have reviewed the patients History and Physical, chart, labs and discussed the procedure including the risks, benefits and alternatives for the proposed anesthesia with the patient or authorized representative who has indicated his/her understanding and acceptance.     Dental advisory given  Plan Discussed with: CRNA  Anesthesia Plan Comments:        Anesthesia Quick Evaluation

## 2022-04-10 DIAGNOSIS — M1612 Unilateral primary osteoarthritis, left hip: Secondary | ICD-10-CM | POA: Diagnosis not present

## 2022-04-10 LAB — CBC
HCT: 28.3 % — ABNORMAL LOW (ref 36.0–46.0)
Hemoglobin: 9.4 g/dL — ABNORMAL LOW (ref 12.0–15.0)
MCH: 31.8 pg (ref 26.0–34.0)
MCHC: 33.2 g/dL (ref 30.0–36.0)
MCV: 95.6 fL (ref 80.0–100.0)
Platelets: 169 10*3/uL (ref 150–400)
RBC: 2.96 MIL/uL — ABNORMAL LOW (ref 3.87–5.11)
RDW: 12.4 % (ref 11.5–15.5)
WBC: 10.4 10*3/uL (ref 4.0–10.5)
nRBC: 0 % (ref 0.0–0.2)

## 2022-04-10 LAB — BASIC METABOLIC PANEL
Anion gap: 8 (ref 5–15)
BUN: 13 mg/dL (ref 8–23)
CO2: 26 mmol/L (ref 22–32)
Calcium: 8.7 mg/dL — ABNORMAL LOW (ref 8.9–10.3)
Chloride: 104 mmol/L (ref 98–111)
Creatinine, Ser: 0.79 mg/dL (ref 0.44–1.00)
GFR, Estimated: >60 mL/min (ref >60)
Glucose, Bld: 161 mg/dL — ABNORMAL HIGH (ref 70–99)
Potassium: 4.9 mmol/L (ref 3.5–5.1)
Sodium: 138 mmol/L (ref 135–145)

## 2022-04-10 MED ORDER — ASPIRIN 81 MG PO CHEW: 81.0000 mg | CHEWABLE_TABLET | Freq: Two times a day (BID) | ORAL | 0 refills | Status: AC

## 2022-04-10 MED ORDER — POLYETHYLENE GLYCOL 3350 17 G PO PACK: 17.0000 g | PACK | Freq: Every day | ORAL | 0 refills | Status: AC | PRN

## 2022-04-10 MED ORDER — SENNA 8.6 MG PO TABS: 2.0000 | ORAL_TABLET | Freq: Every day | ORAL | 0 refills | Status: AC

## 2022-04-10 MED ORDER — HYDROCODONE-ACETAMINOPHEN 10-325 MG PO TABS: 0.5000 | ORAL_TABLET | ORAL | 0 refills | Status: AC | PRN

## 2022-04-10 MED ORDER — DOCUSATE SODIUM 100 MG PO CAPS: 100.0000 mg | ORAL_CAPSULE | Freq: Two times a day (BID) | ORAL | 0 refills | Status: AC

## 2022-04-10 MED ORDER — ONDANSETRON HCL 4 MG PO TABS: 4.0000 mg | ORAL_TABLET | Freq: Three times a day (TID) | ORAL | 0 refills | Status: DC | PRN

## 2022-04-10 NOTE — Evaluation (Addendum)
Physical Therapy Evaluation Patient Details Name: Kara Fox MRN: [id number] DOB: 1955-12-14 Today's Date: 04/10/2022  History of Present Illness  67 yo female s/P L THA, direct anterior on 04/09/22  Clinical Impression  The patient is progressing well with mobility and ambulation. Patient will need to practice 2 steps prior to Dc.  Patient reports burning of the anterior thigh on left when moving, subsides when at rest. Continue PT        Recommendations for follow up therapy are one component of a multi-disciplinary discharge planning process, led by the attending physician.  Recommendations may be updated based on patient status, additional functional criteria and insurance authorization.  Follow Up Recommendations Follow physician's recommendations for discharge plan and follow up therapies      Assistance Recommended at Discharge Intermittent Supervision/Assistance  Patient can return home with the following  A little help with walking and/or transfers;A little help with bathing/dressing/bathroom;Assistance with cooking/housework;Assist for transportation;Help with stairs or ramp for entrance    Equipment Recommendations Rolling walker (2 wheels)  Recommendations for Other Services       Functional Status Assessment Patient has had a recent decline in their functional status and demonstrates the ability to make significant improvements in function in a reasonable and predictable amount of time.     Precautions / Restrictions Precautions Precautions: Fall Restrictions Weight Bearing Restrictions: No      Mobility  Bed Mobility Overal bed mobility: Needs Assistance Bed Mobility: Supine to Sit     Supine to sit: Supervision, HOB elevated     General bed mobility comments: extra time    Transfers Overall transfer level: Needs assistance Equipment used: Rolling walker (2 wheels) Transfers: Sit to/from Stand Sit to Stand: Min assist           General  transfer comment: cues for hand and lt. leg position    Ambulation/Gait Ambulation/Gait assistance: Min assist Gait Distance (Feet): 60 Feet Assistive device: Rolling walker (2 wheels) Gait Pattern/deviations: Step-to pattern, Step-through pattern Gait velocity: decr     General Gait Details: cues ofr sequence  Stairs            Wheelchair Mobility    Modified Rankin (Stroke Patients Only)       Balance Overall balance assessment: Mild deficits observed, not formally tested                                           Pertinent Vitals/Pain Pain Assessment Pain Assessment: 0-10 Pain Score: 6  Pain Location: left thigh Pain Descriptors / Indicators: Burning Pain Intervention(s): Monitored during session, Premedicated before session, Ice applied    Home Living Family/patient expects to be discharged to:: Private residence Living Arrangements: Children Available Help at Discharge: Family;Available 24 hours/day Type of Home: House Home Access: Stairs to enter Entrance Stairs-Rails: None Entrance Stairs-Number of Steps: 2   Home Layout: One level Home Equipment: Cane - single point      Prior Function Prior Level of Function : Needs assist       Physical Assist : ADLs (physical)       ADLs Comments: assist  into tub     Hand Dominance   Dominant Hand: Right    Extremity/Trunk Assessment   Upper Extremity Assessment Upper Extremity Assessment: Overall WFL for tasks assessed    Lower Extremity Assessment Lower Extremity Assessment: RLE deficits/detail RLE Deficits /  Details: able to flex the hip in supine wit assistance    Cervical / Trunk Assessment Cervical / Trunk Assessment: Normal  Communication   Communication: No difficulties  Cognition Arousal/Alertness: Awake/alert Behavior During Therapy: WFL for tasks assessed/performed Overall Cognitive Status: Within Functional Limits for tasks assessed                                           General Comments      Exercises Total Joint Exercises Ankle Circles/Pumps: AROM, Both, 10 reps Quad Sets: AROM, Both, 10 reps Short Arc Quad: AROM, 10 reps, left Heel Slides: AAROM, left, 10 rep Hip ABduction/ADduction: AAROM, left, 10 reps   Assessment/Plan    PT Assessment Patient needs continued PT services  PT Problem List Decreased strength;Decreased activity tolerance;Decreased mobility;Decreased safety awareness;Pain;Decreased knowledge of use of DME       PT Treatment Interventions DME instruction;Stair training;Therapeutic activities;Gait training;Functional mobility training;Therapeutic exercise;Patient/family education    PT Goals (Current goals can be found in the Care Plan section)  Acute Rehab PT Goals Patient Stated Goal: go home, no pain PT Goal Formulation: With patient Time For Goal Achievement: 04/17/22 Potential to Achieve Goals: Good    Frequency 7X/week     Co-evaluation               AM-PAC PT "6 Clicks" Mobility  Outcome Measure Help needed turning from your back to your side while in a flat bed without using bedrails?: None Help needed moving from lying on your back to sitting on the side of a flat bed without using bedrails?: A Little Help needed moving to and from a bed to a chair (including a wheelchair)?: A Little Help needed standing up from a chair using your arms (e.g., wheelchair or bedside chair)?: A Little Help needed to walk in hospital room?: A Little Help needed climbing 3-5 steps with a railing? : A Little 6 Click Score: 19    End of Session Equipment Utilized During Treatment: Gait belt Activity Tolerance: Patient tolerated treatment well Patient left: in chair;with call bell/phone within reach;with chair alarm set Nurse Communication: Mobility status PT Visit Diagnosis: Unsteadiness on feet (R26.81);Pain Pain - Right/Left: Right Pain - part of body: Hip;Leg    Time: 1000-1025 PT  Time Calculation (min) (ACUTE ONLY): 25 min   Charges:   PT Evaluation $PT Eval Low Complexity: 1 Low PT Treatments $Gait Training: 8-22 mins        [place] Office [phone] Weekend [phone]   Claretha Cooper 04/10/2022, 12:24 PM

## 2022-04-10 NOTE — Discharge Instructions (Signed)
? ?Dr. Brian Swinteck ?Joint Replacement Specialist ?[facility] ?[address] ?[place], [place] [address] ?[phone] ? ? ?TOTAL HIP REPLACEMENT POSTOPERATIVE DIRECTIONS ? ? ? ?Hip Rehabilitation, Guidelines Following Surgery  ? ?WEIGHT BEARING ?Weight bearing as tolerated with assist device (walker, cane, etc) as directed, use it as long as suggested by your surgeon or therapist, typically at least 4-6 weeks. ? ?The results of a hip operation are greatly improved after range of motion and muscle strengthening exercises. Follow all safety measures which are given to protect your hip. If any of these exercises cause increased pain or swelling in your joint, decrease the amount until you are comfortable again. Then slowly increase the exercises. Call your caregiver if you have problems or questions.  ? ?HOME CARE INSTRUCTIONS  ?Most of the following instructions are designed to prevent the dislocation of your new hip.  ?Remove items at home which could result in a fall. This includes throw rugs or furniture in walking pathways.  ?Continue medications as instructed at time of discharge. ?You may have some home medications which will be placed on hold until you complete the course of blood thinner medication. ?You may start showering once you are discharged home. Do not remove your dressing. ?Do not put on socks or shoes without following the instructions of your caregivers.   ?Sit on chairs with arms. Use the chair arms to help push yourself up when arising.  ?Arrange for the use of a toilet seat elevator so you are not sitting low.  ?Walk with walker as instructed.  ?You may resume a sexual relationship in one month or when given the OK by your caregiver.  ?Use walker as long as suggested by your caregivers.  ?You may put full weight on your legs and walk as much as is comfortable. ?Avoid periods of inactivity such as sitting longer than an hour when not asleep. This helps prevent blood  clots.  ?You may return to work once you are cleared by your surgeon.  ?Do not drive a car for 6 weeks or until released by your surgeon.  ?Do not drive while taking narcotics.  ?Wear elastic stockings for two weeks following surgery during the day but you may remove then at night.  ?Make sure you keep all of your appointments after your operation with all of your doctors and caregivers. You should call the office at the above phone number and make an appointment for approximately two weeks after the date of your surgery. ?Please pick up a stool softener and laxative for home use as long as you are requiring pain medications. ?ICE to the affected hip every three hours for 30 minutes at a time and then as needed for pain and swelling. Continue to use ice on the hip for pain and swelling from surgery. You may notice swelling that will progress down to the foot and ankle.  This is normal after surgery.  Elevate the leg when you are not up walking on it.   ?It is important for you to complete the blood thinner medication as prescribed by your doctor. ?Continue to use the breathing machine which will help keep your temperature down.  It is common for your temperature to cycle up and down following surgery, especially at night when you are not up moving around and exerting yourself.  The breathing machine keeps your lungs expanded and your temperature down. ? ?RANGE OF MOTION AND STRENGTHENING EXERCISES  ?These exercises are designed to help you   keep full movement of your hip joint. Follow your caregiver's or physical therapist's instructions. Perform all exercises about fifteen times, three times per day or as directed. Exercise both hips, even if you have had only one joint replacement. These exercises can be done on a training (exercise) mat, on the floor, on a table or on a bed. Use whatever works the best and is most comfortable for you. Use music or television while you are exercising so that the exercises are a  pleasant break in your day. This will make your life better with the exercises acting as a break in routine you can look forward to.  ?Lying on your back, slowly slide your foot toward your buttocks, raising your knee up off the floor. Then slowly slide your foot back down until your leg is straight again.  ?Lying on your back spread your legs as far apart as you can without causing discomfort.  ?Lying on your side, raise your upper leg and foot straight up from the floor as far as is comfortable. Slowly lower the leg and repeat.  ?Lying on your back, tighten up the muscle in the front of your thigh (quadriceps muscles). You can do this by keeping your leg straight and trying to raise your heel off the floor. This helps strengthen the largest muscle supporting your knee.  ?Lying on your back, tighten up the muscles of your buttocks both with the legs straight and with the knee bent at a comfortable angle while keeping your heel on the floor.  ? ?SKILLED REHAB INSTRUCTIONS: ?If the patient is transferred to a skilled rehab facility following release from the hospital, a list of the current medications will be sent to the facility for the patient to continue.  When discharged from the skilled rehab facility, please have the facility set up the patient's Home Health Physical Therapy prior to being released. Also, the skilled facility will be responsible for providing the patient with their medications at time of release from the facility to include their pain medication and their blood thinner medication. If the patient is still at the rehab facility at time of the two week follow up appointment, the skilled rehab facility will also need to assist the patient in arranging follow up appointment in our office and any transportation needs. ? ?POST-OPERATIVE OPIOID TAPER INSTRUCTIONS: ?It is important to wean off of your opioid medication as soon as possible. If you do not need pain medication after your surgery it is ok  to stop day one. ?Opioids include: ?Codeine, Hydrocodone(Norco, Vicodin), Oxycodone(Percocet, oxycontin) and hydromorphone amongst others.  ?Long term and even short term use of opiods can cause: ?Increased pain response ?Dependence ?Constipation ?Depression ?Respiratory depression ?And more.  ?Withdrawal symptoms can include ?Flu like symptoms ?Nausea, vomiting ?And more ?Techniques to manage these symptoms ?Hydrate well ?Eat regular healthy meals ?Stay active ?Use relaxation techniques(deep breathing, meditating, yoga) ?Do Not substitute Alcohol to help with tapering ?If you have been on opioids for less than two weeks and do not have pain than it is ok to stop all together.  ?Plan to wean off of opioids ?This plan should start within one week post op of your joint replacement. ?Maintain the same interval or time between taking each dose and first decrease the dose.  ?Cut the total daily intake of opioids by one tablet each day ?Next start to increase the time between doses. ?The last dose that should be eliminated is the evening dose.  ? ? ?MAKE   SURE YOU:  ?Understand these instructions.  ?Will watch your condition.  ?Will get help right away if you are not doing well or get worse. ? ?Pick up stool softner and laxative for home use following surgery while on pain medications. ?Do not remove your dressing. ?The dressing is waterproof--it is OK to take showers. ?Continue to use ice for pain and swelling after surgery. ?Do not use any lotions or creams on the incision until instructed by your surgeon. ?Total Hip Protocol. ? ?

## 2022-04-10 NOTE — TOC Transition Note (Signed)
Transition of Care [facility]) - CM/SW Discharge Note  Patient Details  Name: [place] MRN: [id number] Date of Birth: Aug 25, 1955  Transition of Care [facility] CM/SW Contact:  Sherie Don, LCSW Phone Number: 04/10/2022, 11:12 AM  Clinical Narrative: Patient is expected to discharge home after working with PT. CSW met with patient to confirm discharge plan and needs. Patient will go home with a home exercise program (HEP). Patient will need a youth rolling walker, which was provided by [employer]. TOC signing off.    Final next level of care: Home/Self Care Barriers to Discharge: No Barriers Identified  Patient Goals and CMS Choice CMS Medicare.gov Compare Post Acute Care list provided to:: Patient Choice offered to / list presented to : Patient  Discharge Plan and Services Additional resources added to the After Visit Summary for         DME Arranged: Walker youth DME Agency: [employer] Date DME Agency Contacted: 04/10/22 Representative spoke with at DME Agency: Elta Guadeloupe  Social Determinants of Health ([place]) Interventions SDOH Screenings   Food Insecurity: No Food Insecurity (04/09/2022)  Housing: Low Risk  (04/09/2022)  Transportation Needs: No Transportation Needs (04/09/2022)  Utilities: Not At Risk (04/09/2022)  Tobacco Use: Medium Risk (04/09/2022)   Readmission Risk Interventions     No data to display

## 2022-04-10 NOTE — Progress Notes (Signed)
    Subjective:  Patient reports pain as mild to moderate.  Denies N/V/CP/SOB/Abd pain. She reports her pain as mild in her Left thigh. She denies any tingling or numbness in LE bilaterally.   Objective:   VITALS:   Vitals:   04/09/22 1715 04/09/22 1955 04/10/22 0156 04/10/22 0456  BP: 120/78 116/64 126/71 (!) 110/90  Pulse: (!) 52 76 (!) 58 63  Resp:  16 16 16   Temp: 97.8 F (36.6 C) 98.1 F (36.7 C) 97.8 F (36.6 C) 98 F (36.7 C)  TempSrc: Oral Oral    SpO2: 100% 97% 100% 100%  Weight:      Height:        NAD Neurologically intact ABD soft Neurovascular intact Sensation intact distally Intact pulses distally Dorsiflexion/Plantar flexion intact Incision: dressing C/D/I No cellulitis present Compartment soft   Lab Results  Component Value Date   WBC 10.4 04/10/2022   HGB 9.4 (L) 04/10/2022   HCT 28.3 (L) 04/10/2022   MCV 95.6 04/10/2022   PLT 169 04/10/2022   BMET    Component Value Date/Time   NA 138 04/10/2022 0316   K 4.9 04/10/2022 0316   CL 104 04/10/2022 0316   CO2 26 04/10/2022 0316   GLUCOSE 161 (H) 04/10/2022 0316   BUN 13 04/10/2022 0316   CREATININE 0.79 04/10/2022 0316   CALCIUM 8.7 (L) 04/10/2022 0316   GFRNONAA >60 04/10/2022 0316     Assessment/Plan: 1 Day Post-Op   Principal Problem:   Osteoarthritis of left hip   WBAT with walker DVT ppx: Aspirin, SCDs, TEDS PO pain control PT/OT: PT has not seen yet. PT to come by today.  Dispo: D/c home once cleared with PT.    Charlott Rakes, PA-C 04/10/2022, 10:49 AM   [facility]  [facility] [address], [address], [place], [place] [address] Phone: [phone] [email or web address] Facebook  [employer]

## 2022-04-10 NOTE — Progress Notes (Signed)
Physical Therapy Treatment Patient Details Name: [place] MRN: [id number] DOB: 07-Feb-1956 Today's Date: 04/10/2022   History of Present Illness 67 yo female s/P LEFT  THA, direct anterior on 04/09/22    PT Comments    Patient reports burning of left  thigh  is less. Practiced steps, will have family to assist. Patient has met PT goals for DC.  Recommendations for follow up therapy are one component of a multi-disciplinary discharge planning process, led by the attending physician.  Recommendations may be updated based on patient status, additional functional criteria and insurance authorization.  Follow Up Recommendations  Follow physician's recommendations for discharge plan and follow up therapies     Assistance Recommended at Discharge Intermittent Supervision/Assistance  Patient can return home with the following A little help with walking and/or transfers;A little help with bathing/dressing/bathroom;Assistance with cooking/housework;Assist for transportation;Help with stairs or ramp for entrance   Equipment Recommendations  Rolling walker (2 wheels)    Recommendations for Other Services       Precautions / Restrictions Precautions Precautions: Fall Restrictions Weight Bearing Restrictions: No     Mobility  Bed Mobility Overal bed mobility: Needs Assistance Bed Mobility: Supine to Sit     Supine to sit: Supervision, HOB elevated     General bed mobility comments: in recliner    Transfers Overall transfer level: Needs assistance Equipment used: Rolling walker (2 wheels) Transfers: Sit to/from Stand Sit to Stand: Supervision           General transfer comment: cues for hand and left leg position    Ambulation/Gait Ambulation/Gait assistance: Supervision Gait Distance (Feet): 40 Feet Assistive device: Rolling walker (2 wheels) Gait Pattern/deviations: Step-to pattern, Step-through pattern Gait velocity: decr     General Gait Details: cues  for sequence   Stairs Stairs: Yes Stairs assistance: Min assist Stair Management: With cane, Forwards Number of Stairs: 2 General stair comments: HHA, "post   Wheelchair Mobility    Modified Rankin (Stroke Patients Only)       Balance Overall balance assessment: Mild deficits observed, not formally tested                                          Cognition Arousal/Alertness: Awake/alert Behavior During Therapy: WFL for tasks assessed/performed Overall Cognitive Status: Within Functional Limits for tasks assessed                                          Exercises T  General Comments        Pertinent Vitals/Pain Pain Assessment Pain Assessment: 0-10 Pain Score: 6  Pain Location: left thigh Pain Descriptors / Indicators: Discomfort Pain Intervention(s): Monitored during session, Premedicated before session, Ice applied    Home Living Family/patient expects to be discharged to:: Private residence Living Arrangements: Children Available Help at Discharge: Family;Available 24 hours/day Type of Home: House Home Access: Stairs to enter Entrance Stairs-Rails: None Entrance Stairs-Number of Steps: 2   Home Layout: One level Home Equipment: Cane - single point      Prior Function            PT Goals (current goals can now be found in the care plan section) Acute Rehab PT Goals Patient Stated Goal: go home, no pain PT Goal Formulation: With patient Time  For Goal Achievement: 04/17/22 Potential to Achieve Goals: Good Progress towards PT goals: Progressing toward goals    Frequency    7X/week      PT Plan Current plan remains appropriate    Co-evaluation              AM-PAC PT "6 Clicks" Mobility   Outcome Measure  Help needed turning from your back to your side while in a flat bed without using bedrails?: None Help needed moving from lying on your back to sitting on the side of a flat bed without using  bedrails?: A Little Help needed moving to and from a bed to a chair (including a wheelchair)?: A Little Help needed standing up from a chair using your arms (e.g., wheelchair or bedside chair)?: A Little Help needed to walk in hospital room?: A Little Help needed climbing 3-5 steps with a railing? : A Little 6 Click Score: 19    End of Session Equipment Utilized During Treatment: Gait belt Activity Tolerance: Patient tolerated treatment well Patient left: in chair;with call bell/phone within reach;with chair alarm set Nurse Communication: Mobility status PT Visit Diagnosis: Unsteadiness on feet (R26.81);Pain Pain - Right/Left: Right Pain - part of body: Hip;Leg     Time: 8295-6213 PT Time Calculation (min) (ACUTE ONLY): 20 min  Charges:  $Gait Training: 8-22 mins                     Cut Off Office [phone] Weekend [phone]    Claretha Cooper 04/10/2022, 3:54 PM

## 2022-04-14 ENCOUNTER — Encounter (HOSPITAL_COMMUNITY): Payer: Self-pay | Admitting: Orthopedic Surgery

## 2022-04-19 ENCOUNTER — Emergency Department (HOSPITAL_COMMUNITY)
Admission: EM | Admit: 2022-04-19 | Discharge: 2022-04-19 | Disposition: A | Discharge status: Home / Self Care | Payer: Medicaid Other | Attending: Emergency Medicine | Admitting: Emergency Medicine

## 2022-04-19 ENCOUNTER — Emergency Department (HOSPITAL_BASED_OUTPATIENT_CLINIC_OR_DEPARTMENT_OTHER): Payer: Medicaid Other

## 2022-04-19 ENCOUNTER — Emergency Department (HOSPITAL_COMMUNITY): Payer: Medicaid Other

## 2022-04-19 ENCOUNTER — Encounter (HOSPITAL_COMMUNITY): Payer: Self-pay

## 2022-04-19 DIAGNOSIS — Z79899 Other long term (current) drug therapy: Secondary | ICD-10-CM | POA: Insufficient documentation

## 2022-04-19 DIAGNOSIS — E876 Hypokalemia: Secondary | ICD-10-CM | POA: Insufficient documentation

## 2022-04-19 DIAGNOSIS — R52 Pain, unspecified: Secondary | ICD-10-CM | POA: Diagnosis not present

## 2022-04-19 DIAGNOSIS — M25552 Pain in left hip: Secondary | ICD-10-CM | POA: Diagnosis not present

## 2022-04-19 DIAGNOSIS — W19XXXA Unspecified fall, initial encounter: Secondary | ICD-10-CM | POA: Diagnosis not present

## 2022-04-19 DIAGNOSIS — R600 Localized edema: Secondary | ICD-10-CM | POA: Diagnosis not present

## 2022-04-19 DIAGNOSIS — I10 Essential (primary) hypertension: Secondary | ICD-10-CM | POA: Insufficient documentation

## 2022-04-19 DIAGNOSIS — Z7982 Long term (current) use of aspirin: Secondary | ICD-10-CM | POA: Insufficient documentation

## 2022-04-19 DIAGNOSIS — M7989 Other specified soft tissue disorders: Secondary | ICD-10-CM

## 2022-04-19 DIAGNOSIS — R2242 Localized swelling, mass and lump, left lower limb: Secondary | ICD-10-CM

## 2022-04-19 LAB — CBC WITH DIFFERENTIAL/PLATELET
Abs Immature Granulocytes: 0.03 10*3/uL (ref 0.00–0.07)
Basophils Absolute: 0 10*3/uL (ref 0.0–0.1)
Basophils Relative: 0 %
Eosinophils Absolute: 0.1 10*3/uL (ref 0.0–0.5)
Eosinophils Relative: 1 %
HCT: 26 % — ABNORMAL LOW (ref 36.0–46.0)
Hemoglobin: 8.5 g/dL — ABNORMAL LOW (ref 12.0–15.0)
Immature Granulocytes: 1 %
Lymphocytes Relative: 22 %
Lymphs Abs: 1.5 10*3/uL (ref 0.7–4.0)
MCH: 31.5 pg (ref 26.0–34.0)
MCHC: 32.7 g/dL (ref 30.0–36.0)
MCV: 96.3 fL (ref 80.0–100.0)
Monocytes Absolute: 0.6 10*3/uL (ref 0.1–1.0)
Monocytes Relative: 9 %
Neutro Abs: 4.5 10*3/uL (ref 1.7–7.7)
Neutrophils Relative %: 67 %
Platelets: 367 10*3/uL (ref 150–400)
RBC: 2.7 MIL/uL — ABNORMAL LOW (ref 3.87–5.11)
RDW: 12.6 % (ref 11.5–15.5)
WBC: 6.7 10*3/uL (ref 4.0–10.5)
nRBC: 0 % (ref 0.0–0.2)

## 2022-04-19 LAB — BASIC METABOLIC PANEL
Anion gap: 6 (ref 5–15)
BUN: 11 mg/dL (ref 8–23)
CO2: 30 mmol/L (ref 22–32)
Calcium: 8.7 mg/dL — ABNORMAL LOW (ref 8.9–10.3)
Chloride: 102 mmol/L (ref 98–111)
Creatinine, Ser: 0.63 mg/dL (ref 0.44–1.00)
GFR, Estimated: >60 mL/min (ref >60)
Glucose, Bld: 106 mg/dL — ABNORMAL HIGH (ref 70–99)
Potassium: 3.4 mmol/L — ABNORMAL LOW (ref 3.5–5.1)
Sodium: 138 mmol/L (ref 135–145)

## 2022-04-19 NOTE — Discharge Instructions (Addendum)
You have been seen today for your complaint of fall, left leg swelling. Your lab work showed a low blood count. You should follow up with your PCP about this. Your imaging was reassuring and showed no abnormalities. Home care instructions are as follows:  Elevate the leg when resting. Walk with a walker. Change positions slowly. Try to have someone nearby when you are walking Follow up with: your surgeon as soon as possible regarding your leg swelling. Also follow up with your primary care provider regarding your low blood count Please seek immediate medical care if you develop any of the following symptoms: Fevers, chills, increase in pain, dizziness, light headedness At this time there does not appear to be the presence of an emergent medical condition, however there is always the potential for conditions to change. Please read and follow the below instructions.  Do not take your medicine if  develop an itchy rash, swelling in your mouth or lips, or difficulty breathing; call 911 and seek immediate emergency medical attention if this occurs.  You may review your lab tests and imaging results in their entirety on your MyChart account.  Please discuss all results of fully with your primary care provider and other specialist at your follow-up visit.  Note: Portions of this text may have been transcribed using voice recognition software. Every effort was made to ensure accuracy; however, inadvertent computerized transcription errors may still be present.

## 2022-04-19 NOTE — ED Triage Notes (Signed)
Pt arrived via [place], from home. Multiple mechanical falls since surgery 2/1. [place] again this morning, left hip pain worsening.

## 2022-04-19 NOTE — ED Notes (Signed)
Patient D/C, she is waiting on daughter to come pick her up and bring assistive device.

## 2022-04-19 NOTE — ED Notes (Addendum)
RN assisted in ambulating patient. She informed staff that she normally uses a walker to ambulate,  she was a 2 person assist. She was able to ambulate with [place] from her bed to the room door and back but c/o pain and stiffness, provider aware

## 2022-04-19 NOTE — ED Provider Notes (Signed)
[place] [facility] DEPARTMENT AT [facility] Provider Note   CSN: [id number] Arrival date & time: 04/19/22  1039     History  Chief Complaint  Patient presents with   Fall   Hip Pain         Lynnda Roxan Ragucci is a 67 y.o. female.  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  She states that she went to stand up out of a chair this afternoon and lost her balance and fell backwards.  She landed on her buttocks.  She did not hit her head.  She believes she is taking a blood thinner but does not know what it is called.  81 mg aspirin is listed in her chart.  She states she has some mild aching pain in her left lower extremity but has no significant pain.  She denies numbness or tingling of either lower extremity.  She reports that she has had increasing left lower extremity swelling which she attributed to her recent surgery.  No numbness or tingling.  She reports urinary frequency and urgency since her surgery.  Denies abdominal pain, nausea, vomiting, chest pain, shortness of breath, cough, dizziness, lightheadedness, confusion, flank pain.  She had her last bowel movement 3 days ago but is still passing gas.  She states she fell because she felt off balance.  She also fell the day after her surgery. States she wanted to make sure she does not have a blood clot.    Fall  Hip Pain       Home Medications Prior to Admission medications   Medication Sig Start Date End Date Taking? Authorizing Provider  aspirin (ASPIRIN CHILDRENS) 81 MG chewable tablet Chew 1 tablet (81 mg total) by mouth 2 (two) times daily with a meal. 04/10/22 05/25/22  Hill, Marciano Sequin, PA-C  docusate sodium (COLACE) 100 MG capsule Take 1 capsule (100 mg total) by mouth 2 (two) times daily. 04/10/22 05/10/22  Charlott Rakes, PA-C  methadone (DOLOPHINE) 10 MG/ML solution Take 110 mg by mouth in the morning.    [provider]  ondansetron (ZOFRAN) 4 MG tablet Take 1 tablet (4 mg total) by mouth every 8 (eight) hours as needed for nausea or vomiting. 04/10/22 04/10/23  Charlott Rakes, PA-C  polyethylene glycol (MIRALAX) 17 g packet Take 17 g by mouth daily as needed for mild constipation or moderate constipation. 04/10/22 05/10/22  Charlott Rakes, PA-C  senna (SENOKOT) 8.6 MG TABS tablet Take 2 tablets (17.2 mg total) by mouth at bedtime for 15 days. 04/10/22 04/25/22  Charlott Rakes, PA-C      Allergies    Patient has no known allergies.    Review of Systems   Review of Systems  Musculoskeletal:  Positive for arthralgias, joint swelling and myalgias.    Physical Exam Updated Vital Signs BP 138/89   Pulse 80   Temp 98 F (36.7 C) (Oral)   Resp 18   SpO2 100%  Physical Exam Vitals and nursing note reviewed.  Constitutional:      General: She is not in acute distress.    Appearance: She is well-developed. She is not ill-appearing, toxic-appearing or diaphoretic.  HENT:     Head: Normocephalic and atraumatic.  Eyes:     Conjunctiva/sclera: Conjunctivae normal.  Cardiovascular:     Rate and Rhythm: Normal rate and regular rhythm.     Pulses:  Dorsalis pedis pulses are 2+ on the right side and 2+ on the left side.     Heart sounds: No murmur heard. Pulmonary:     Effort: Pulmonary effort is normal. No respiratory distress.     Breath sounds: Normal breath sounds. No stridor. No wheezing, rhonchi or rales.  Abdominal:     Palpations: Abdomen is soft.     Tenderness: There is no abdominal tenderness. There is no guarding or rebound.  Musculoskeletal:        General: Swelling present.     Cervical back: Neck supple.     Right lower leg: No edema.     Left lower leg: Edema present.     Comments: Swelling of the entire left lower extremity.  2+ pitting edema.  Contralateral lower extremity normal.  Minimal tenderness to palpation of the left hip.  Bandage in place overlying incision site with no active  drainage or surrounding erythema. Moderate swelling surrounding the incision sight. Full ROM of the left knee, ankle, toes  Skin:    General: Skin is warm and dry.     Capillary Refill: Capillary refill takes less than 2 seconds.  Neurological:     General: No focal deficit present.     Mental Status: She is alert and oriented to person, place, and time.  Psychiatric:        Mood and Affect: Mood normal.     ED Results / Procedures / Treatments   Labs (all labs ordered are listed, but only abnormal results are displayed) Labs Reviewed  BASIC METABOLIC PANEL - Abnormal; Notable for the following components:      Result Value   Potassium 3.4 (*)    Glucose, Bld 106 (*)    Calcium 8.7 (*)    All other components within normal limits  CBC WITH DIFFERENTIAL/PLATELET - Abnormal; Notable for the following components:   RBC 2.70 (*)    Hemoglobin 8.5 (*)    HCT 26.0 (*)    All other components within normal limits    EKG None  Radiology VAS [place] LOWER EXTREMITY VENOUS (DVT) (7a-7p)  Result Date: 04/19/2022  Lower Venous DVT Study Patient Name:  ALARA BUCKLEY  Date of Exam:   04/19/2022 Medical Rec #: [id number]            Accession #:    [id number] Date of Birth: 1956/01/21           Patient Gender: F Patient Age:   54 years Exam Location:  [facility] Procedure:      VAS [place] LOWER EXTREMITY VENOUS (DVT) Referring Phys: Sheppard Coil Secundino Ellithorpe --------------------------------------------------------------------------------  Indications: Pain, and Swelling.  Risk Factors: Surgery Left hip replacement on 04/09/22. Limitations: Poor ultrasound/tissue interface. Comparison Study: No previous exams Performing Technologist: Jody Hill RVT, RDMS  Examination Guidelines: A complete evaluation includes B-mode imaging, spectral Doppler, color Doppler, and power Doppler as needed of all accessible portions of each vessel. Bilateral testing is considered an integral part of a complete examination.  Limited examinations for reoccurring indications may be performed as noted. The reflux portion of the exam is performed with the patient in reverse Trendelenburg.  +-----+---------------+---------+-----------+----------+--------------+ RIGHTCompressibilityPhasicitySpontaneityPropertiesThrombus Aging +-----+---------------+---------+-----------+----------+--------------+ CFV  Full           Yes      Yes                                 +-----+---------------+---------+-----------+----------+--------------+   +---------+---------------+---------+-----------+----------+-------------------+  LEFT     CompressibilityPhasicitySpontaneityPropertiesThrombus Aging      +---------+---------------+---------+-----------+----------+-------------------+ CFV      Full           Yes      Yes                                      +---------+---------------+---------+-----------+----------+-------------------+ SFJ      Full                                                             +---------+---------------+---------+-----------+----------+-------------------+ FV Prox  Full           Yes      Yes                                      +---------+---------------+---------+-----------+----------+-------------------+ FV Mid   Full           Yes      Yes                                      +---------+---------------+---------+-----------+----------+-------------------+ FV DistalFull           Yes      Yes                                      +---------+---------------+---------+-----------+----------+-------------------+ PFV      Full                                                             +---------+---------------+---------+-----------+----------+-------------------+ POP      Full           Yes      Yes                                      +---------+---------------+---------+-----------+----------+-------------------+ PTV                                                    Not well visualized +---------+---------------+---------+-----------+----------+-------------------+ PERO                                                  Not well visualized +---------+---------------+---------+-----------+----------+-------------------+ Peroneal and posterior tibial not well visualized due to extensive overlying subcutaneous edema.   Summary: RIGHT: - No evidence of common femoral vein obstruction.  LEFT: - There is no evidence of deep vein thrombosis in the lower extremity. However,  portions of this examination were limited- see technologist comments above.  - No cystic structure found in the popliteal fossa. - Subcutaneous edema in areas of calf and ankle.  *See table(s) above for measurements and observations.    Preliminary    DG Hip Unilat W or Wo Pelvis 2-3 Views Left  Result Date: 04/19/2022 CLINICAL DATA:  Fall with LEFT hip pain. EXAM: DG HIP (WITH OR WITHOUT PELVIS) 2-3V LEFT COMPARISON:  04/09/2022 radiograph and 01/09/2020 abdomen/pelvic CT FINDINGS: LEFT hip arthroplasty changes identified. There is no evidence of acute fracture or dislocation. No hardware complicating features noted. Severe degenerative changes within the RIGHT hip are again noted with remodeling/deformity of the RIGHT femoral head. IMPRESSION: 1. No evidence of acute bony abnormality. 2. LEFT hip arthroplasty without hardware complicating features. 3. 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 Electronically Signed   By: Margarette Canada M.D.   On: 04/19/2022 11:57    Procedures Procedures    Medications Ordered in ED Medications - No data to display  ED Course/ Medical Decision Making/ A&P                             Medical Decision Making Amount and/or Complexity of Data Reviewed Labs: ordered.  This patient presents to the ED for concern of two falls, this involves an extensive number of treatment options, and is a complaint  that carries with it a high risk of complications and morbidity.  The differential diagnosis includes UTI, DVT, vertigo  Co morbidities that complicate the patient evaluation  left THA on 09/07/2022, arthritis, hypertension, hepatitis  My initial workup includes basic labs, x ray left hip, DVT study, urinalysis  Additional history obtained from: Nursing notes from this visit. Previous records within EMR system notes from [place] on 09/07/2022  I ordered, reviewed and interpreted labs which include:CBC, BMP, urinalysis. Slight hypokalemia of 3.4. hemoglobin of 8.5. baseline of 12.0. was 9.4 the day after surgery.  I ordered imaging studies including x ray left hip, left lower extremity DVT study. I independently visualized and interpreted imaging which showed negative x ray and DVT study I agree with the radiologist interpretation  Cardiac Monitoring:  The patient was maintained on a cardiac monitor.  I personally viewed and interpreted the cardiac monitored which showed an underlying rhythm of: NSR  Hemodynamically stable. 67 year old female presenting to the ED for evaluation of a fall. States she stood up to use her walker and lost her balance. Had no prodromal symptoms or near syncope. Appears to be a mechanical fall. She is 10 days post L THA.  She states she has some moderate pain with this but is managed with her home medications.  On exam she has a swollen left lower extremity and some swelling around the incision site with no warmth or erythema.  Her neurovascular status is intact. Her labs revealed an anemia of 8.5. Which is down from 9.4 9 days ago. Likely sequelae of recent surgery.  Strongly encouraged patient to follow-up with her primary care provider regarding this.  She was also encouraged to follow-up with her surgeon as soon as possible next week regarding the left lower extremity swelling.  She states she has an appointment scheduled.  X-ray negative.  DVT study negative.  She has  been in no acute distress throughout her stay. Complaining of no other symptoms. Wants to make sure she does not have a  DVT. She ambulated in the ED.  She was given return precautions.  Stable at discharge.  At this time there does not appear to be any evidence of an acute emergency medical condition and the patient appears stable for discharge with appropriate outpatient follow up. Diagnosis was discussed with patient who verbalizes understanding of care plan and is agreeable to discharge. I have discussed return precautions with patient who verbalizes understanding. Patient encouraged to follow-up with their PCP within 1 week. All questions answered.  Patient's case discussed with Dr. Kathrynn Humble who agrees with plan to discharge with follow-up.   Note: Portions of this report may have been transcribed using voice recognition software. Every effort was made to ensure accuracy; however, inadvertent computerized transcription errors may still be present.        Final Clinical Impression(s) / ED Diagnoses Final diagnoses:  Fall, initial encounter  Localized swelling of left lower leg    Rx / DC Orders ED Discharge Orders     None         Roylene Reason, Hershal Coria 04/19/22 [place], Ankit, MD 04/22/22 1526

## 2022-04-19 NOTE — Progress Notes (Signed)
LLE venous duplex has been completed.  Preliminary results given to [employer], PA-C.   Results can be found under chart review under CV PROC. 04/19/2022 12:47 PM Cyle Kenyon RVT, RDMS

## 2022-04-28 IMAGING — CT CT ABD-PELV W/ CM
2 of 5 series · 15 of 46 positions shown, 17 images · IV contrast (omnipaque)
Comparison: None.

CLINICAL DATA: Left upper quadrant pain.  History of substance use.

EXAM:
CT ABDOMEN AND PELVIS WITH CONTRAST
TECHNIQUE: Multidetector CT imaging of the abdomen and pelvis was performed
using the standard protocol following bolus administration of
intravenous contrast.
CONTRAST:  100mL OMNIPAQUE IOHEXOL 300 MG/ML  SOLN

[Series 2: axial st · axial · 0.65mm/px · z∈[+1010,+1375]mm · 12 of 85 slices shown, 14 images]
[im 6/85  soft-tissue]
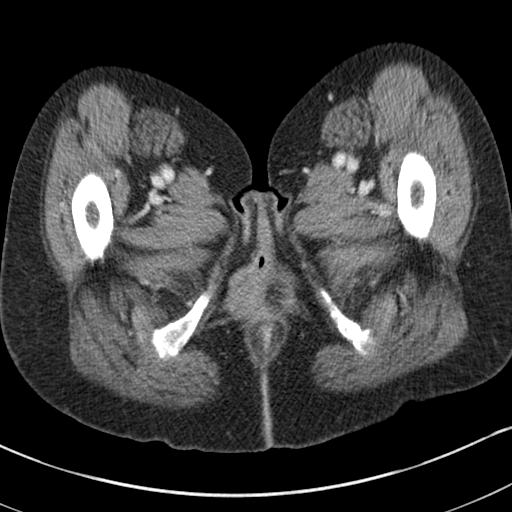
[im 6/85  bone]
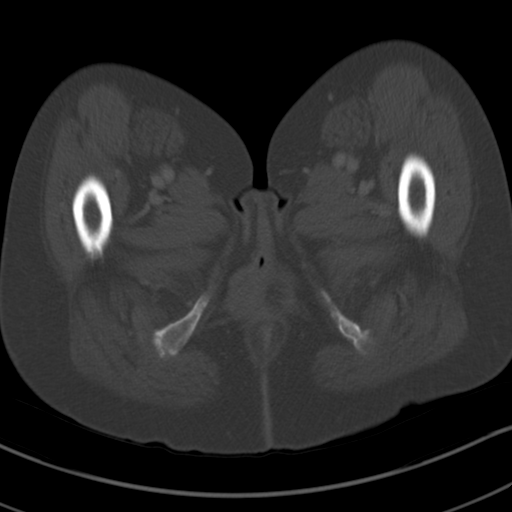
[im 12/85  soft-tissue]
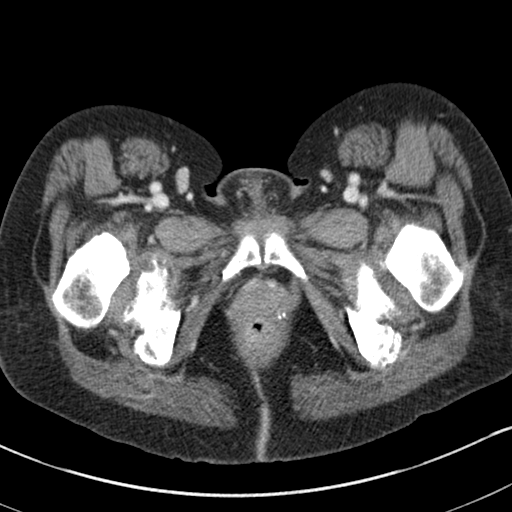
[im 17/85  soft-tissue]
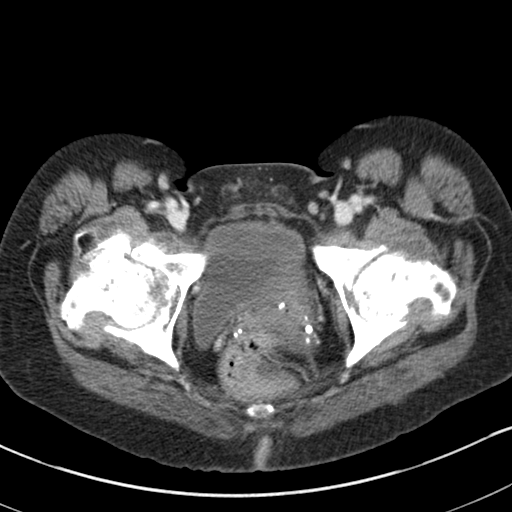
[im 29/85  soft-tissue]
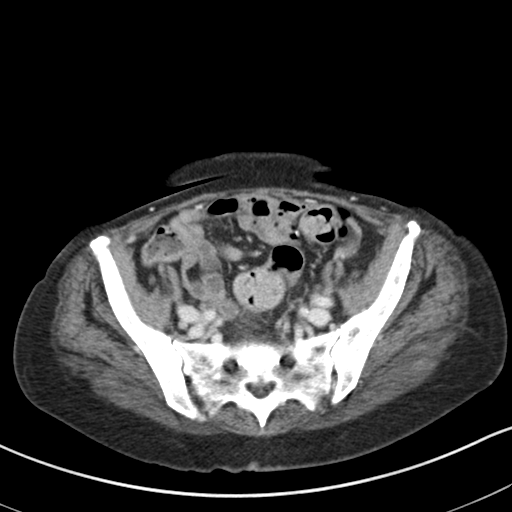
[im 34/85  soft-tissue]
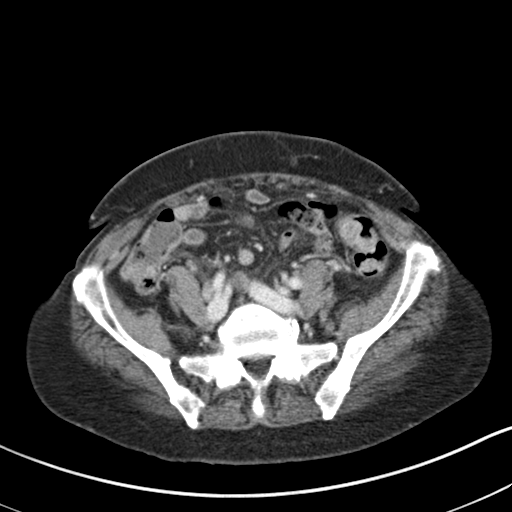
[im 40/85  soft-tissue]
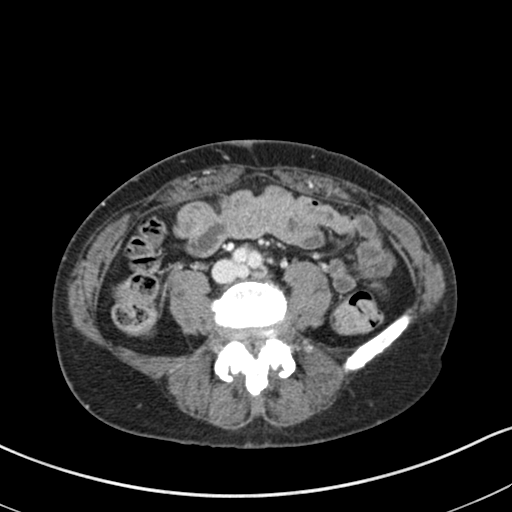
[im 45/85  soft-tissue]
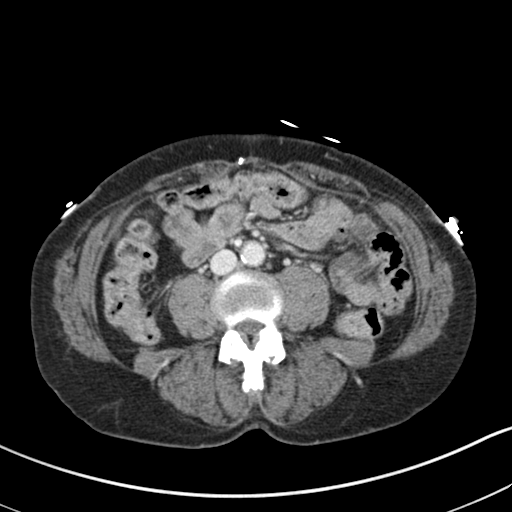
[im 51/85  soft-tissue]
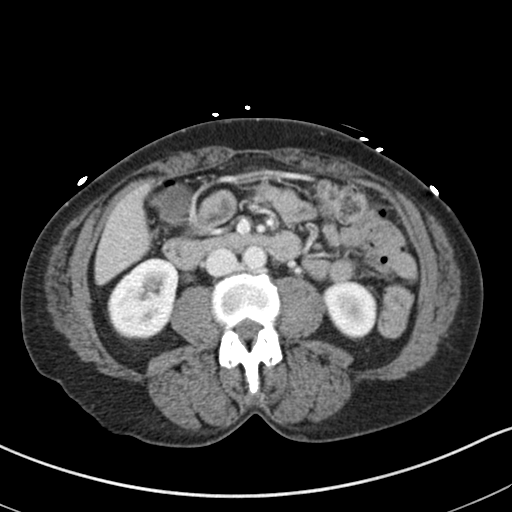
[im 57/85  soft-tissue]
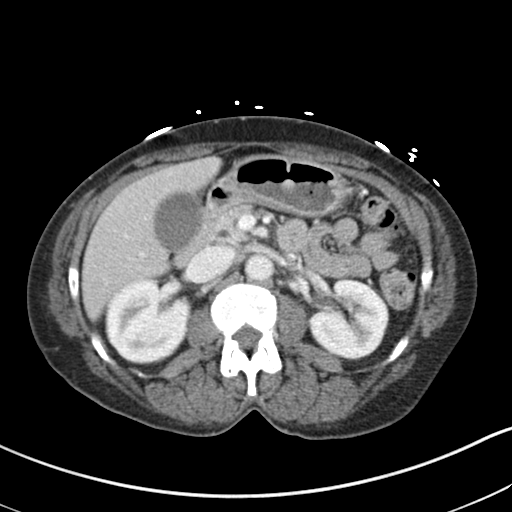
[im 57/85  bone]
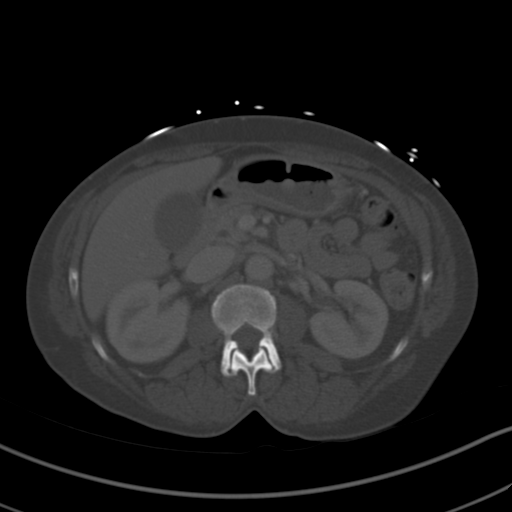
[im 68/85  soft-tissue]
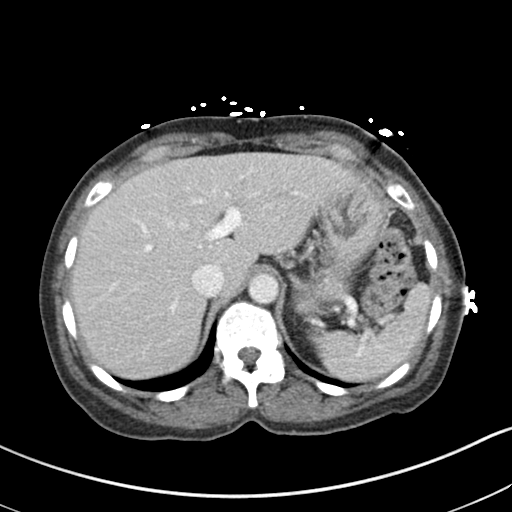
[im 73/85  soft-tissue]
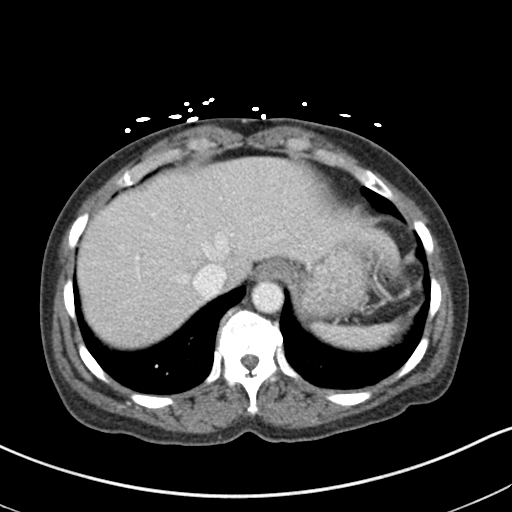
[im 79/85  soft-tissue]
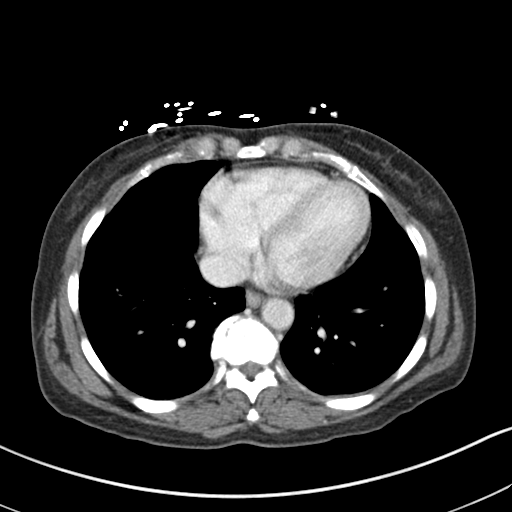

[Series 5: coronal st · coronal · 0.60mm/px · 3 of 119 slices shown]
[im 40/119  soft-tissue]
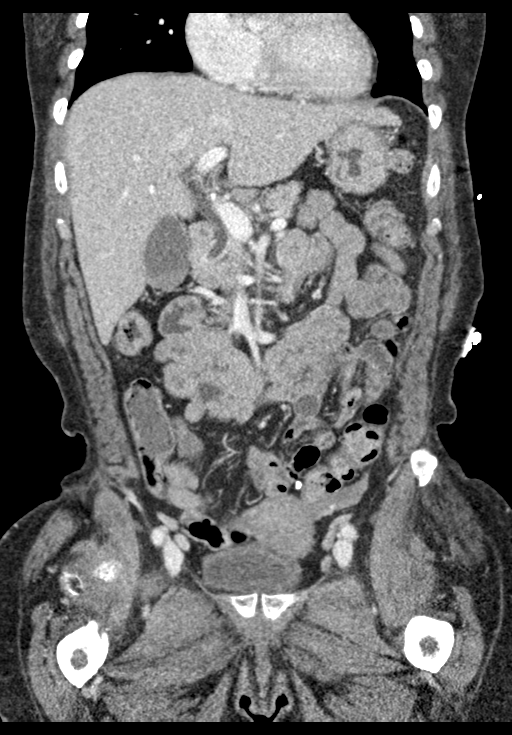
[im 53/119  soft-tissue]
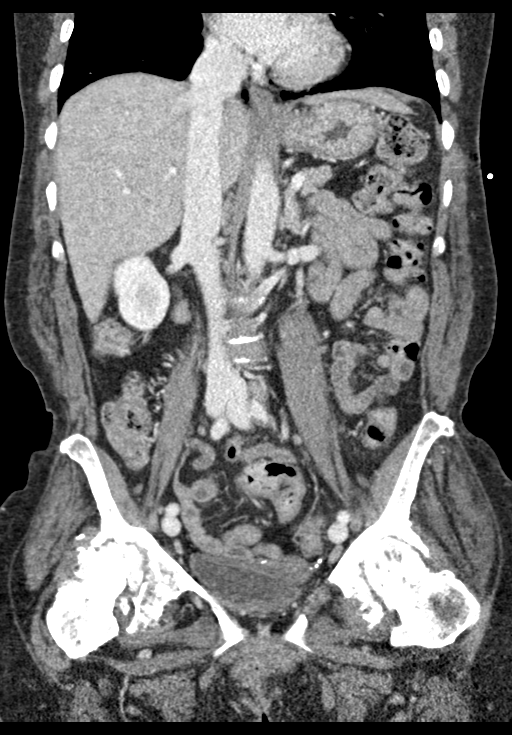
[im 66/119  soft-tissue]
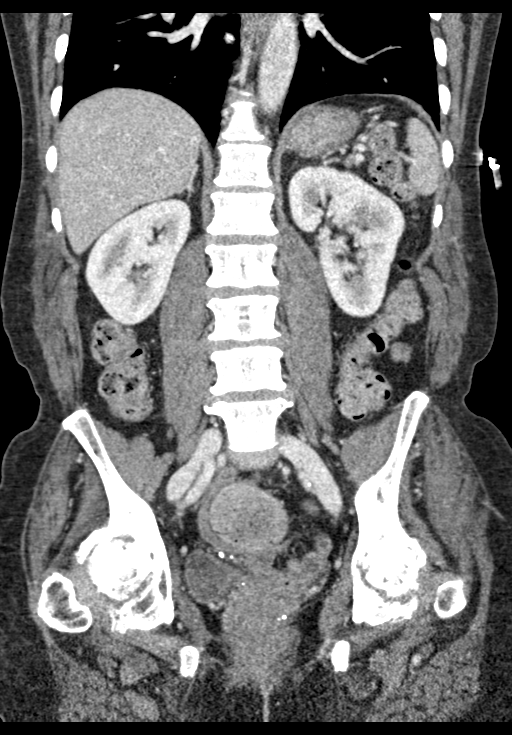

[15 of 46 positions shown; findings below may reference images not displayed]

FINDINGS: Lower chest: Bilateral lower lobe subsegmental atelectasis. No acute
abnormality.

Hepatobiliary: No focal liver abnormality. Layering hyperdensity
within the gallbladder lumen suggestive of gallbladder sludge versus
tiny gallstones. No gallbladder wall thickening or pericholecystic
fluid. No biliary dilatation.

Pancreas: No focal lesion. Normal pancreatic contour. No surrounding
inflammatory changes. No main pancreatic ductal dilatation.

Spleen: Normal in size without focal abnormality.

Adrenals/Urinary Tract: No adrenal nodule bilaterally. Bilateral
kidneys enhance symmetrically. No hydronephrosis. No hydroureter.
The urinary bladder is unremarkable.

Stomach/Bowel: Stomach is within normal limits. No evidence of bowel
wall thickening or dilatation. Appendix appears normal.

Vascular/Lymphatic: No abdominal aorta or iliac aneurysm. Mild
atherosclerotic plaque of the aorta and its branches. No abdominal,
pelvic, or inguinal lymphadenopathy.

Reproductive: Uterus and bilateral adnexa are unremarkable.

Other: No intraperitoneal free fluid. No intraperitoneal free gas.
No organized fluid collection.

Musculoskeletal:

Suggestion of ventral hernia repair. No abdominal wall hernia or
abnormality

No suspicious lytic or blastic osseous lesions. No acute displaced
fracture. Multilevel degenerative changes of the spine with grade 1
anterolisthesis of L4 on L5. Extensive bilateral hip degenerative
changes with remodeling of the right femoral head.
IMPRESSION: 1. Gallbladder sludge versus tiny gallstones with no findings to
suggest acute cholecystitis or choledocholithiasis.
2. Extensive bilateral hip degenerative changes with remodeling of
the right femoral head. If not already followed, please consider
nonemergent orthopedic consultation.

## 2022-04-28 IMAGING — US US ABDOMEN LIMITED
1 series · 14 of 25 positions shown · non-contrast
Comparison: 01/09/2020

CLINICAL DATA: Epigastric pain for 4 days

EXAM:
ULTRASOUND ABDOMEN LIMITED RIGHT UPPER QUADRANT

[Series 1: us abdomen limited · 14 of 38 slices shown]
[im 1/38]
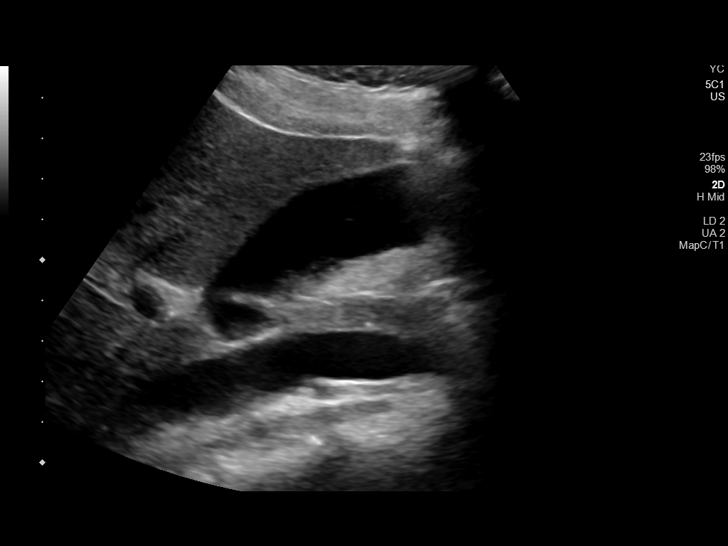
[im 4/38]
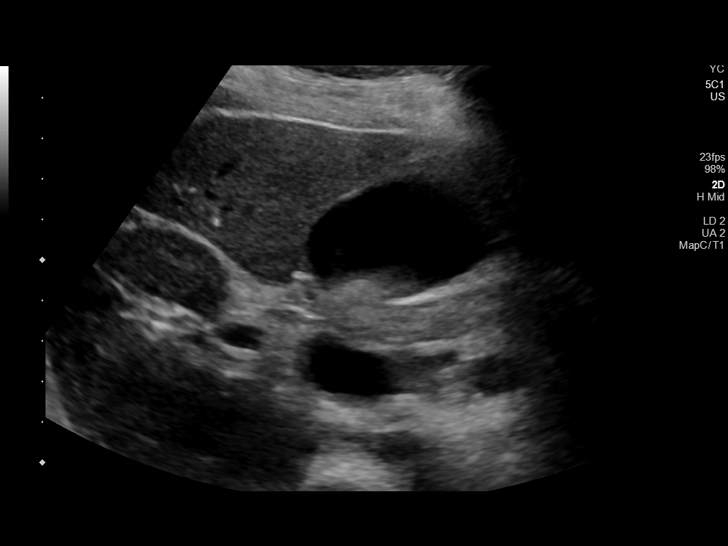
[im 7/38]
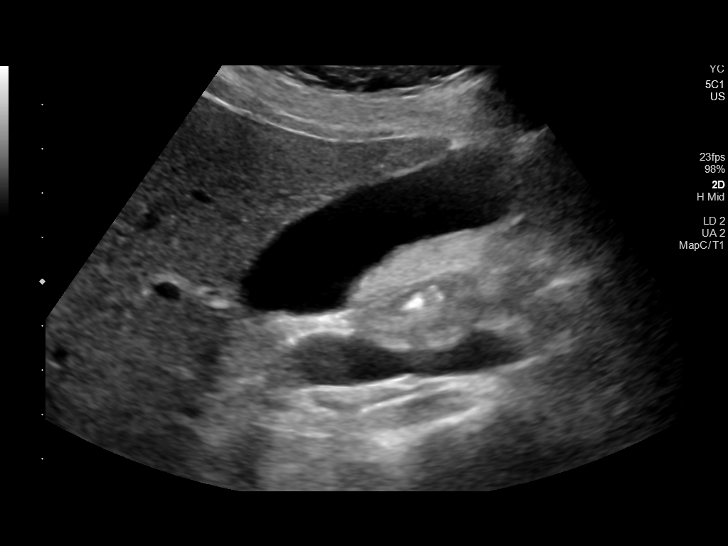
[im 10/38]
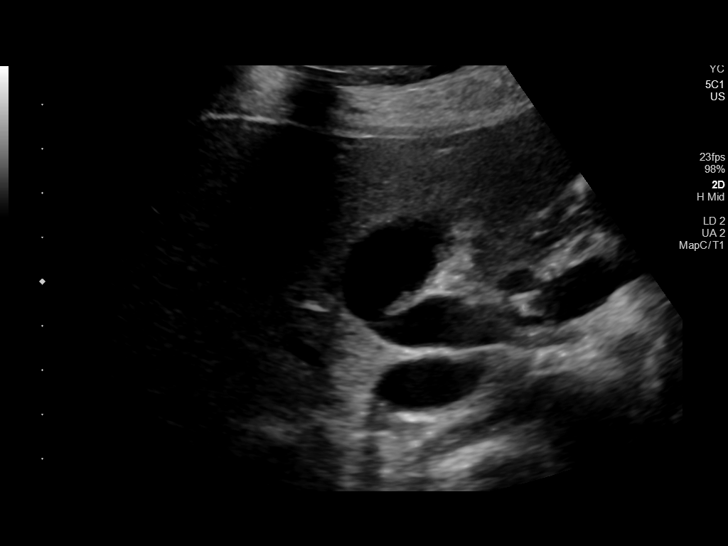
[im 13/38]
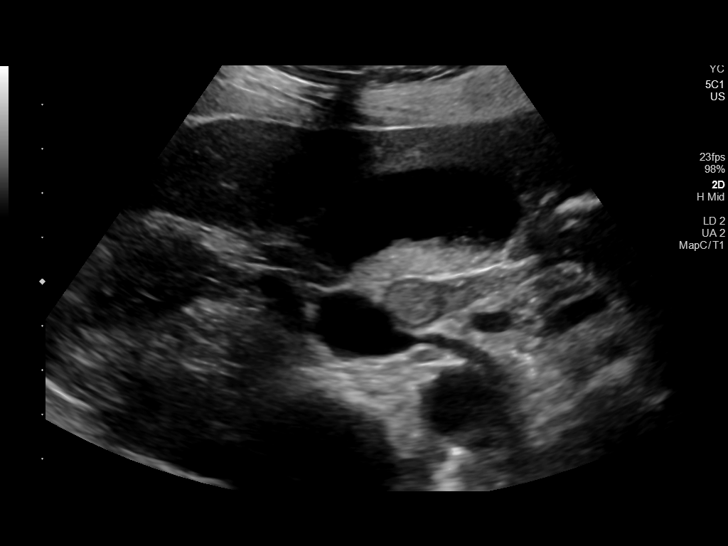
[im 14/38]
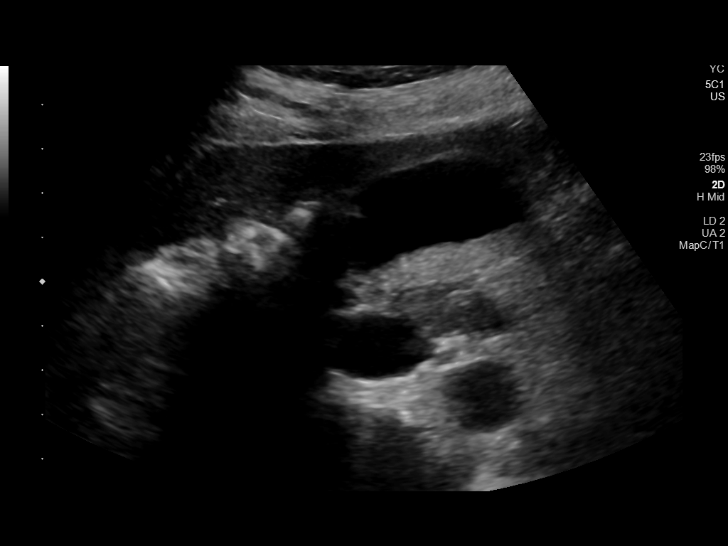
[im 17/38]
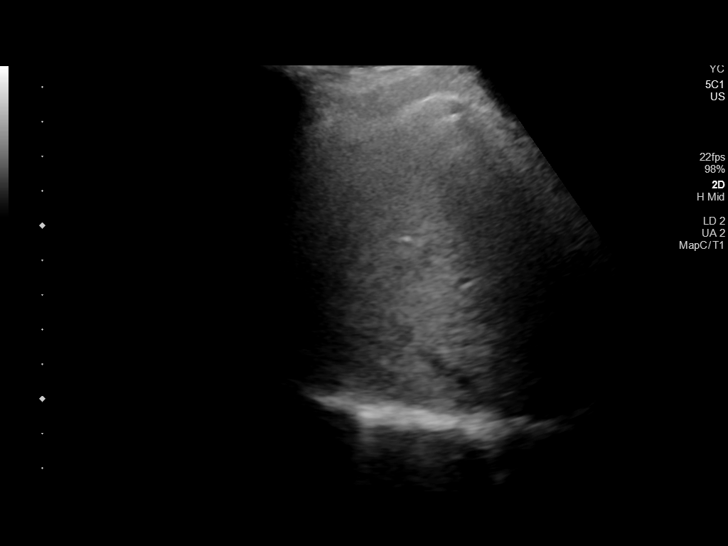
[im 21/38]
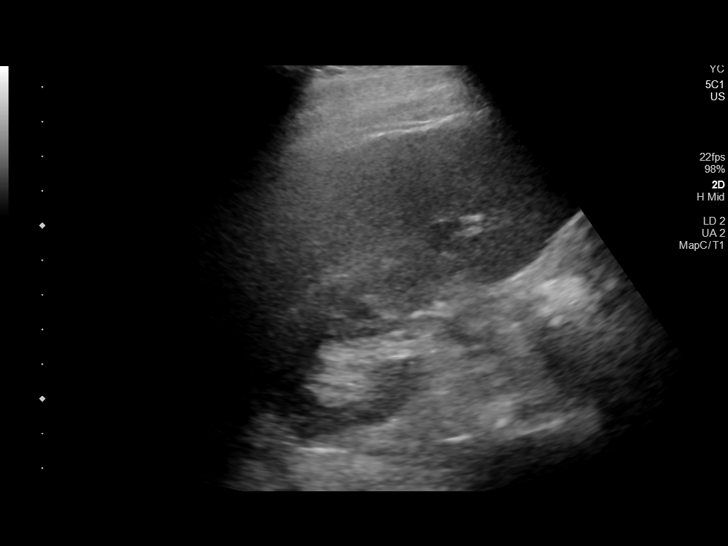
[im 24/38]
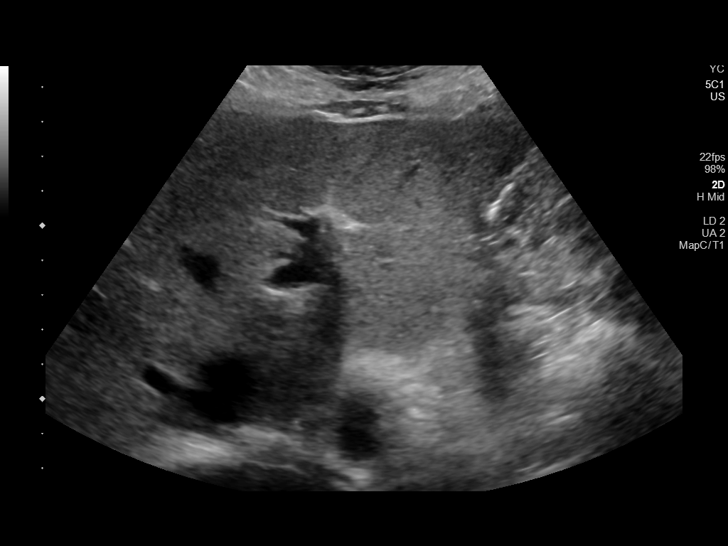
[im 25/38]
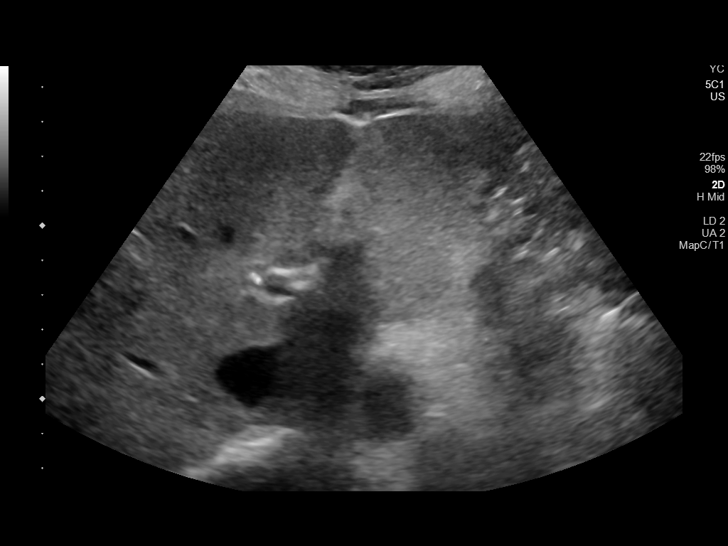
[im 28/38]
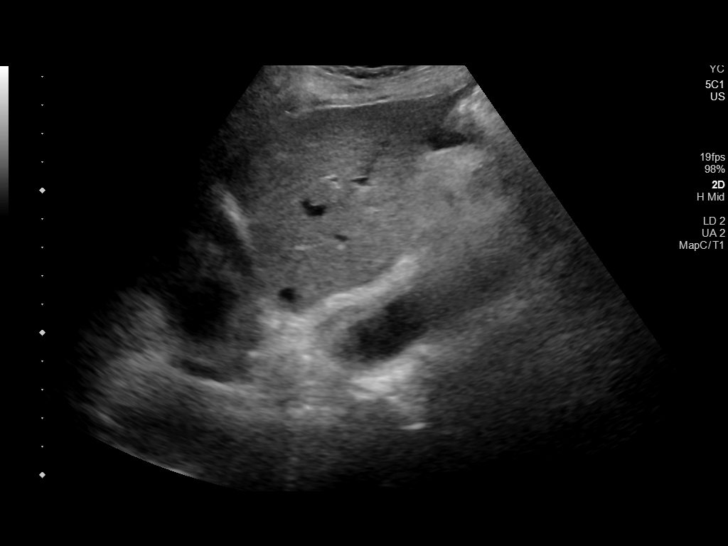
[im 31/38]
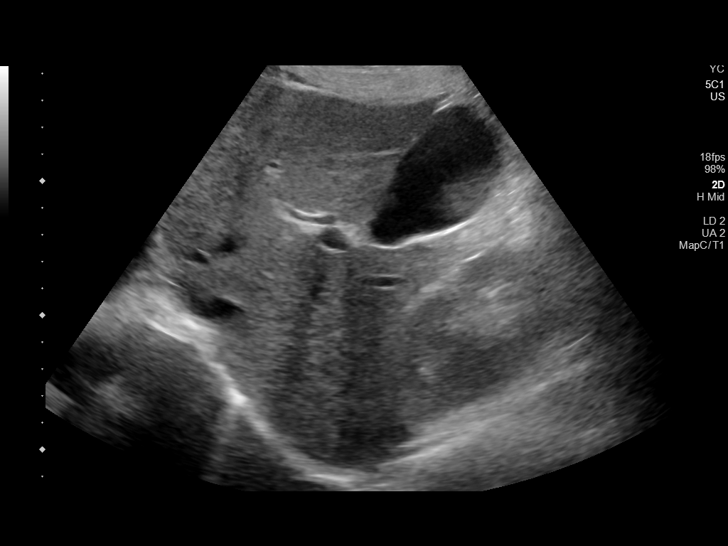
[im 34/38]
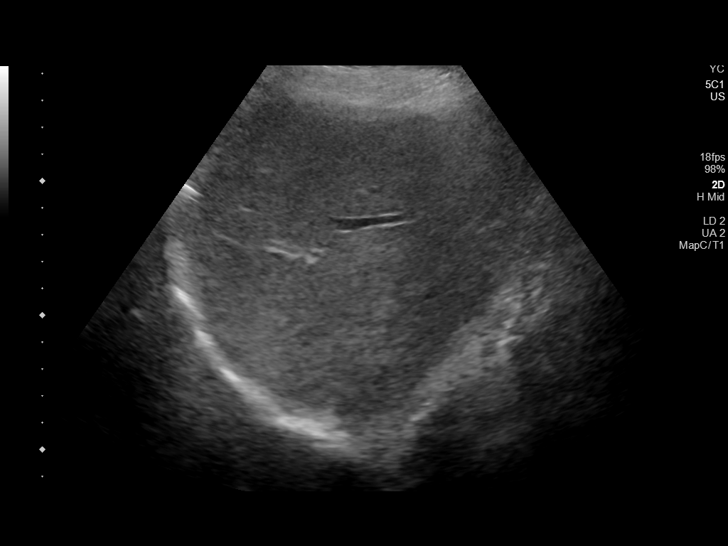
[im 38/38]
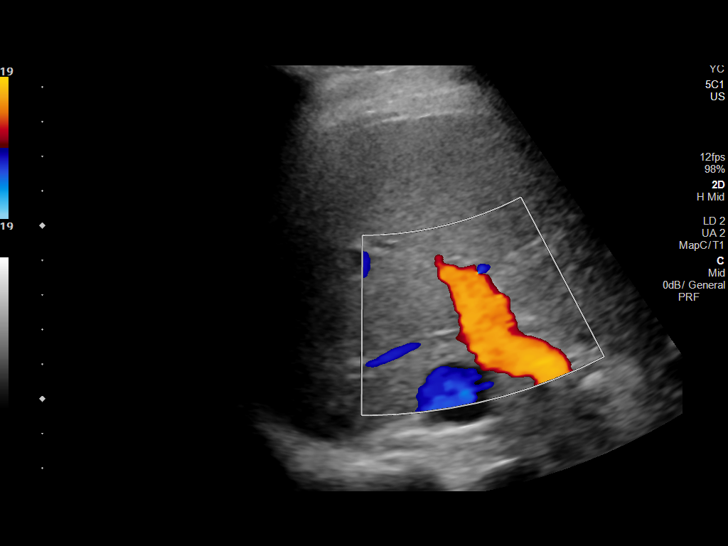

[14 of 25 positions shown; findings below may reference images not displayed]

FINDINGS: Gallbladder:

Gallbladder sludge layers dependently within the gallbladder. No
shadowing gallstones or evidence of cholecystitis.

Common bile duct:

Diameter: 4 mm

Liver:

No focal lesion identified. Within normal limits in parenchymal
echogenicity. Portal vein is patent on color Doppler imaging with
normal direction of blood flow towards the liver.

Other: None.
IMPRESSION: 1. Gallbladder sludge. No evidence of cholelithiasis or
cholecystitis.

## 2022-05-11 ENCOUNTER — Ambulatory Visit: Payer: Self-pay | Admitting: Student

## 2022-05-11 NOTE — Progress Notes (Signed)
Sent message, via epic in basket, requesting orders in epic from surgeon.  

## 2022-05-14 NOTE — Patient Instructions (Addendum)
SURGICAL WAITING ROOM VISITATION  Patients having surgery or a procedure may have no more than 2 support people in the waiting area - these visitors may rotate.    Children under the age of 24 must have an adult with them who is not the patient.  Due to an increase in RSV and influenza rates and associated hospitalizations, children ages 13 and under may not visit patients in [place].  If the patient needs to stay at the hospital during part of their recovery, the visitor guidelines for inpatient rooms apply. Pre-op nurse will coordinate an appropriate time for 1 support person to accompany patient in pre-op.  This support person may not rotate.    Please refer to the [facility] website for the visitor guidelines for Inpatients (after your surgery is over and you are in a regular room).    Your procedure is scheduled on: 05/27/22   Report to [facility] Main Entrance    Report to admitting at 9:45 AM   Call this number if you have problems the morning of surgery [phone]   Do not eat food :After Midnight.   After Midnight you may have the following liquids until 9:15 AM DAY OF SURGERY  Water Non-Citrus Juices (without pulp, NO RED-Apple, White grape, White cranberry) Black Coffee (NO MILK/CREAM OR CREAMERS, sugar ok)  Clear Tea (NO MILK/CREAM OR CREAMERS, sugar ok) regular and decaf                             Plain Jell-O (NO RED)                                           Fruit ices (not with fruit pulp, NO RED)                                     Popsicles (NO RED)                                                               Sports drinks like Gatorade (NO RED)                  The day of surgery:  Drink ONE (1) Pre-Surgery Clear Ensure at 9:15 AM the morning of surgery. Drink in one sitting. Do not sip.  This drink was given to you during your hospital  pre-op appointment visit. Nothing else to drink after completing the  Pre-Surgery Clear  Ensure.          If you have questions, please contact your surgeon's office.   FOLLOW BOWEL PREP AND ANY ADDITIONAL PRE OP INSTRUCTIONS YOU RECEIVED FROM YOUR SURGEON'S OFFICE!!!     Oral Hygiene is also important to reduce your risk of infection.                                    Remember - BRUSH YOUR TEETH THE MORNING OF SURGERY WITH YOUR REGULAR TOOTHPASTE  DENTURES  WILL BE REMOVED PRIOR TO SURGERY PLEASE DO NOT APPLY "Poly grip" OR ADHESIVES!!!   Take these medicines the morning of surgery with A SIP OF WATER: Methadone                              You may not have any metal on your body including hair pins, jewelry, and body piercing             Do not wear make-up, lotions, powders, perfumes, or deodorant  Do not wear nail polish including gel and S&S, artificial/acrylic nails, or any other type of covering on natural nails including finger and toenails. If you have artificial nails, gel coating, etc. that needs to be removed by a nail salon please have this removed prior to surgery or surgery may need to be canceled/ delayed if the surgeon/ anesthesia feels like they are unable to be safely monitored.   Do not shave  48 hours prior to surgery.    Do not bring valuables to the hospital. [place].   Contacts, glasses, dentures or bridgework may not be worn into surgery.   Bring small overnight bag day of surgery.   DO NOT [place]. PHARMACY WILL DISPENSE MEDICATIONS LISTED ON YOUR MEDICATION LIST TO YOU DURING YOUR ADMISSION [place]!              Please read over the following fact sheets you were given: IF [place] [phone] Schneiders    If you received a COVID test during your pre-op visit  it is requested that you wear a mask when out in public, stay away from anyone that may not be feeling well and notify your surgeon if you  develop symptoms. If you test positive for Covid or have been in contact with anyone that has tested positive in the last 10 days please notify you surgeon.    [place] - Preparing for Surgery Before surgery, you can play an important role.  Because skin is not sterile, your skin needs to be as free of germs as possible.  You can reduce the number of germs on your skin by washing with CHG (chlorahexidine gluconate) soap before surgery.  CHG is an antiseptic cleaner which kills germs and bonds with the skin to continue killing germs even after washing. Please DO NOT use if you have an allergy to CHG or antibacterial soaps.  If your skin becomes reddened/irritated stop using the CHG and inform your nurse when you arrive at Short Stay. Do not shave (including legs and underarms) for at least 48 hours prior to the first CHG shower.  You may shave your face/neck.  Please follow these instructions carefully:  1.  Shower with CHG Soap the night before surgery and the  morning of surgery.  2.  If you choose to wash your hair, wash your hair first as usual with your normal  shampoo.  3.  After you shampoo, rinse your hair and body thoroughly to remove the shampoo.                             4.  Use CHG as you would any other liquid soap.  You can apply chg  directly to the skin and wash.  Gently with a scrungie or clean washcloth.  5.  Apply the CHG Soap to your body ONLY FROM THE NECK DOWN.   Do   not use on face/ open                           Wound or open sores. Avoid contact with eyes, ears mouth and   genitals (private parts).                       Wash face,  Genitals (private parts) with your normal soap.             6.  Wash thoroughly, paying special attention to the area where your    surgery  will be performed.  7.  Thoroughly rinse your body with warm water from the neck down.  8.  DO NOT shower/wash with your normal soap after using and rinsing off the CHG Soap.                9.  Pat  yourself dry with a clean towel.            10.  Wear clean pajamas.            11.  Place clean sheets on your bed the night of your first shower and do not  sleep with pets. Day of Surgery : Do not apply any lotions/deodorants the morning of surgery.  Please wear clean clothes to the hospital/surgery center.  FAILURE TO FOLLOW THESE INSTRUCTIONS MAY RESULT IN THE CANCELLATION OF YOUR SURGERY  PATIENT SIGNATURE_________________________________  NURSE SIGNATURE__________________________________  ________________________________________________________________________  Kara Fox  An incentive spirometer is a tool that can help keep your lungs clear and active. This tool measures how well you are filling your lungs with each breath. Taking long deep breaths may help reverse or decrease the chance of developing breathing (pulmonary) problems (especially infection) following: A long period of time when you are unable to move or be active. BEFORE THE PROCEDURE  If the spirometer includes an indicator to show your best effort, your nurse or respiratory therapist will set it to a desired goal. If possible, sit up straight or lean slightly forward. Try not to slouch. Hold the incentive spirometer in an upright position. INSTRUCTIONS FOR USE  Sit on the edge of your bed if possible, or sit up as far as you can in bed or on a chair. Hold the incentive spirometer in an upright position. Breathe out normally. Place the mouthpiece in your mouth and seal your lips tightly around it. Breathe in slowly and as deeply as possible, raising the piston or the ball toward the top of the column. Hold your breath for 3-5 seconds or for as long as possible. Allow the piston or ball to fall to the bottom of the column. Remove the mouthpiece from your mouth and breathe out normally. Rest for a few seconds and repeat Steps 1 through 7 at least 10 times every 1-2 hours when you are awake. Take your time  and take a few normal breaths between deep breaths. The spirometer may include an indicator to show your best effort. Use the indicator as a goal to work toward during each repetition. After each set of 10 deep breaths, practice coughing to be sure your lungs are clear. If you have an incision (the cut made at the time of surgery),  support your incision when coughing by placing a pillow or rolled up towels firmly against it. Once you are able to get out of bed, walk around indoors and cough well. You may stop using the incentive spirometer when instructed by your caregiver.  RISKS AND COMPLICATIONS Take your time so you do not get dizzy or light-headed. If you are in pain, you may need to take or ask for pain medication before doing incentive spirometry. It is harder to take a deep breath if you are having pain. AFTER USE Rest and breathe slowly and easily. It can be helpful to keep track of a log of your progress. Your caregiver can provide you with a simple table to help with this. If you are using the spirometer at home, follow these instructions: [place] IF:  You are having difficultly using the spirometer. You have trouble using the spirometer as often as instructed. Your pain medication is not giving enough relief while using the spirometer. You develop fever of 100.5 F (38.1 C) or higher. SEEK IMMEDIATE MEDICAL CARE IF:  You cough up bloody sputum that had not been present before. You develop fever of 102 F (38.9 C) or greater. You develop worsening pain at or near the incision site. MAKE SURE YOU:  Understand these instructions. Will watch your condition. Will get help right away if you are not doing well or get worse. Document Released: 07/06/2006 Document Revised: 05/18/2011 Document Reviewed: 09/06/2006 Exit[employer] Patient Information 2014 Exit[employer], [place].   ________________________________________________________________________ WHAT IS A BLOOD TRANSFUSION? Blood  Transfusion Information  A transfusion is the replacement of blood or some of its parts. Blood is made up of multiple cells which provide different functions. Red blood cells carry oxygen and are used for blood loss replacement. White blood cells fight against infection. Platelets control bleeding. Plasma helps clot blood. Other blood products are available for specialized needs, such as hemophilia or other clotting disorders. BEFORE THE TRANSFUSION  Who gives blood for transfusions?  Healthy volunteers who are fully evaluated to make sure their blood is safe. This is blood bank blood. Transfusion therapy is the safest it has ever been in the practice of medicine. Before blood is taken from a donor, a complete history is taken to make sure that person has no history of diseases nor engages in risky social behavior (examples are intravenous drug use or sexual activity with multiple partners). The donor's travel history is screened to minimize risk of transmitting infections, such as malaria. The donated blood is tested for signs of infectious diseases, such as HIV and hepatitis. The blood is then tested to be sure it is compatible with you in order to minimize the chance of a transfusion reaction. If you or a relative donates blood, this is often done in anticipation of surgery and is not appropriate for emergency situations. It takes many days to process the donated blood. RISKS AND COMPLICATIONS Although transfusion therapy is very safe and saves many lives, the main dangers of transfusion include:  Getting an infectious disease. Developing a transfusion reaction. This is an allergic reaction to something in the blood you were given. Every precaution is taken to prevent this. The decision to have a blood transfusion has been considered carefully by your caregiver before blood is given. Blood is not given unless the benefits outweigh the risks. AFTER THE TRANSFUSION Right after receiving a blood  transfusion, you will usually feel much better and more energetic. This is especially true if your red blood  cells have gotten low (anemic). The transfusion raises the level of the red blood cells which carry oxygen, and this usually causes an energy increase. The nurse administering the transfusion will monitor you carefully for complications. HOME CARE INSTRUCTIONS  No special instructions are needed after a transfusion. You may find your energy is better. Speak with your caregiver about any limitations on activity for underlying diseases you may have. SEEK MEDICAL CARE IF:  Your condition is not improving after your transfusion. You develop redness or irritation at the intravenous (IV) site. SEEK IMMEDIATE MEDICAL CARE IF:  Any of the following symptoms occur over the next 12 hours: Shaking chills. You have a temperature by mouth above 102 F (38.9 C), not controlled by medicine. Chest, back, or muscle pain. People around you feel you are not acting correctly or are confused. Shortness of breath or difficulty breathing. Dizziness and fainting. You get a rash or develop hives. You have a decrease in urine output. Your urine turns a dark color or changes to pink, red, or brown. Any of the following symptoms occur over the next 10 days: You have a temperature by mouth above 102 F (38.9 C), not controlled by medicine. Shortness of breath. Weakness after normal activity. The white part of the eye turns yellow (jaundice). You have a decrease in the amount of urine or are urinating less often. Your urine turns a dark color or changes to pink, red, or brown. Document Released: 02/21/2000 Document Revised: 05/18/2011 Document Reviewed: 10/10/2007 [facility] Patient Information 2014 [place], [place].  _______________________________________________________________________

## 2022-05-14 NOTE — Progress Notes (Addendum)
COVID Vaccine Completed: yes  Date of COVID positive in last 90 days: no  PCP - Benito Mccreedy, MD Cardiologist - n/a  Chest x-ray - n/a EKG - 04/01/22 Epic Stress Test - n/a ECHO - n/a Cardiac Cath - n/a Pacemaker/ICD device last checked: n/a Spinal Cord Stimulator: n/a  Bowel Prep - no  Sleep Study - n/a CPAP -   Fasting Blood Sugar - n/a Checks Blood Sugar _____ times a day  Last dose of GLP1 agonist-  N/A GLP1 instructions:  N/A   Last dose of SGLT-2 inhibitors-  N/A SGLT-2 instructions: N/A   Blood Thinner Instructions: n/a Aspirin Instructions:  Last Dose:  Activity level: Can go up a flight of stairs and perform activities of daily living without stopping and without symptoms of chest pain or shortness of breath.   Anesthesia review: Hgb 9.6, HTN, chronic hep C, on methadone   Patient denies shortness of breath, fever, cough and chest pain at PAT appointment  Patient verbalized understanding of instructions that were given to them at the PAT appointment. Patient was also instructed that they will need to review over the PAT instructions again at home before surgery.

## 2022-05-18 ENCOUNTER — Encounter (HOSPITAL_COMMUNITY)
Admission: RE | Admit: 2022-05-18 | Discharge: 2022-05-18 | Disposition: A | Discharge status: Home / Self Care | Payer: Medicaid Other | Source: Ambulatory Visit | Attending: Orthopedic Surgery | Admitting: Orthopedic Surgery

## 2022-05-18 ENCOUNTER — Encounter (HOSPITAL_COMMUNITY): Payer: Self-pay

## 2022-05-18 VITALS — BP 126/76 | HR 88 | Temp 98.6°F | Resp 16 | Ht 61.0 in | Wt 129.0 lb

## 2022-05-18 DIAGNOSIS — Z87891 Personal history of nicotine dependence: Secondary | ICD-10-CM | POA: Diagnosis not present

## 2022-05-18 DIAGNOSIS — M1611 Unilateral primary osteoarthritis, right hip: Secondary | ICD-10-CM | POA: Diagnosis not present

## 2022-05-18 DIAGNOSIS — Z01812 Encounter for preprocedural laboratory examination: Secondary | ICD-10-CM | POA: Insufficient documentation

## 2022-05-18 DIAGNOSIS — K759 Inflammatory liver disease, unspecified: Secondary | ICD-10-CM

## 2022-05-18 DIAGNOSIS — J45909 Unspecified asthma, uncomplicated: Secondary | ICD-10-CM | POA: Insufficient documentation

## 2022-05-18 DIAGNOSIS — I10 Essential (primary) hypertension: Secondary | ICD-10-CM

## 2022-05-18 DIAGNOSIS — Z01818 Encounter for other preprocedural examination: Secondary | ICD-10-CM

## 2022-05-18 HISTORY — DX: Unspecified asthma, uncomplicated: J45.909

## 2022-05-18 LAB — COMPREHENSIVE METABOLIC PANEL
ALT: 9 U/L (ref 0–44)
AST: 17 U/L (ref 15–41)
Albumin: 3.7 g/dL (ref 3.5–5.0)
Alkaline Phosphatase: 69 U/L (ref 38–126)
Anion gap: 9 (ref 5–15)
BUN: 7 mg/dL — ABNORMAL LOW (ref 8–23)
CO2: 25 mmol/L (ref 22–32)
Calcium: 8.9 mg/dL (ref 8.9–10.3)
Chloride: 102 mmol/L (ref 98–111)
Creatinine, Ser: 0.71 mg/dL (ref 0.44–1.00)
GFR, Estimated: >60 mL/min (ref >60)
Glucose, Bld: 92 mg/dL (ref 70–99)
Potassium: 3.7 mmol/L (ref 3.5–5.1)
Sodium: 136 mmol/L (ref 135–145)
Total Bilirubin: 0.4 mg/dL (ref 0.3–1.2)
Total Protein: 8.5 g/dL — ABNORMAL HIGH (ref 6.5–8.1)

## 2022-05-18 LAB — SURGICAL PCR SCREEN
MRSA, PCR: NEGATIVE
Staphylococcus aureus: POSITIVE — AB

## 2022-05-18 LAB — CBC
HCT: 30.8 % — ABNORMAL LOW (ref 36.0–46.0)
Hemoglobin: 9.6 g/dL — ABNORMAL LOW (ref 12.0–15.0)
MCH: 29.9 pg (ref 26.0–34.0)
MCHC: 31.2 g/dL (ref 30.0–36.0)
MCV: 96 fL (ref 80.0–100.0)
Platelets: 322 10*3/uL (ref 150–400)
RBC: 3.21 MIL/uL — ABNORMAL LOW (ref 3.87–5.11)
RDW: 13.5 % (ref 11.5–15.5)
WBC: 4.8 10*3/uL (ref 4.0–10.5)
nRBC: 0 % (ref 0.0–0.2)

## 2022-05-18 NOTE — Progress Notes (Signed)
Hgb 9.6, results routed to Dr. Lyla Glassing

## 2022-05-20 NOTE — Progress Notes (Signed)
Anesthesia Chart Review   Case: [id number] Date/Time: 05/27/22 1200   Procedure: TOTAL HIP ARTHROPLASTY ANTERIOR APPROACH (Right: Hip) - 140 can use Collins time for flip room   Anesthesia type: Spinal   Pre-op diagnosis: Right hip osteoarthritis   Location: [facility] ROOM 07 / [facility]   Surgeons: Rod Can, MD       DISCUSSION:66 y.o. former smoker with h/o HTN, hepatitis, right hip OA scheduled for above procedure 05/27/2022 with Dr. Rod Can.   Hemoglobin 9.6, forwarded to Dr. Lyla Glassing.  Looks like baseline around 12 prior to last hip surgery in February.   VS: BP 126/76   Pulse 88   Temp 37 C (Oral)   Resp 16   Ht '5\' 1"'$  (1.549 m)   Wt 58.5 kg   SpO2 100%   BMI 24.37 kg/m   PROVIDERS: Benito Mccreedy, MD PCP    LABS:  forwarded to Dr. Lyla Glassing (all labs ordered are listed, but only abnormal results are displayed)  Labs Reviewed  SURGICAL PCR SCREEN - Abnormal; Notable for the following components:      Result Value   Staphylococcus aureus POSITIVE (*)    All other components within normal limits  COMPREHENSIVE METABOLIC PANEL - Abnormal; Notable for the following components:   BUN 7 (*)    Total Protein 8.5 (*)    All other components within normal limits  CBC - Abnormal; Notable for the following components:   RBC 3.21 (*)    Hemoglobin 9.6 (*)    HCT 30.8 (*)    All other components within normal limits  TYPE AND SCREEN     IMAGES:   EKG:   CV:  Past Medical History:  Diagnosis Date   Arthritis    Asthma    childhood   Hepatitis    Hypertension     Past Surgical History:  Procedure Laterality Date   CESAREAN SECTION     HERNIA REPAIR     TOTAL HIP ARTHROPLASTY Left 04/09/2022   Procedure: TOTAL HIP ARTHROPLASTY ANTERIOR APPROACH;  Surgeon: Rod Can, MD;  Location: [facility];  Service: Orthopedics;  Laterality: Left;  125    MEDICATIONS:  aspirin (ASPIRIN CHILDRENS) 81 MG chewable tablet   ibuprofen (ADVIL) 200 MG tablet    methadone (DOLOPHINE) 10 MG/ML solution   ondansetron (ZOFRAN) 4 MG tablet   No current facility-administered medications for this encounter.      Konrad Felix Ward, PA-C [facility] Pre-Surgical Testing [phone]

## 2022-05-25 ENCOUNTER — Ambulatory Visit: Payer: Self-pay | Admitting: Student

## 2022-05-26 ENCOUNTER — Ambulatory Visit: Payer: Self-pay | Admitting: Student

## 2022-05-26 NOTE — H&P (View-Only) (Signed)
TOTAL HIP ADMISSION H&P  Patient is admitted for right total hip arthroplasty.  Subjective:  Chief Complaint: right hip pain  HPI: Tabrina Misiewicz, 67 y.o. female, has a history of pain and functional disability in the right hip(s) due to arthritis and patient has failed non-surgical conservative treatments for greater than 12 weeks to include NSAID's and/or analgesics, flexibility and strengthening excercises, use of assistive devices, and activity modification.  Onset of symptoms was gradual starting 10 years ago with rapidlly worsening course since that time.The patient noted no past surgery on the right hip(s).  Patient currently rates pain in the right hip at 10 out of 10 with activity. Patient has night pain, worsening of pain with activity and weight bearing, trendelenberg gait, pain that interfers with activities of daily living, and pain with passive range of motion. Patient has evidence of subchondral cysts, subchondral sclerosis, periarticular osteophytes, and joint space narrowing by imaging studies. This condition presents safety issues increasing the risk of falls.  There is no current active infection.  Patient Active Problem List   Diagnosis Date Noted   Osteoarthritis of left hip 04/09/2022   Past Medical History:  Diagnosis Date   Arthritis    Asthma    childhood   Hepatitis    Hypertension     Past Surgical History:  Procedure Laterality Date   CESAREAN SECTION     HERNIA REPAIR     TOTAL HIP ARTHROPLASTY Left 04/09/2022   Procedure: TOTAL HIP ARTHROPLASTY ANTERIOR APPROACH;  Surgeon: Rod Can, MD;  Location[facility];  Service: Orthopedics;  Laterality: Left;  125    Current Outpatient Medications  Medication Sig Dispense Refill Last Dose   ibuprofen (ADVIL) 200 MG tablet Take 200-400 mg by mouth every 6 (six) hours as needed for moderate pain.      methadone (DOLOPHINE) 10 MG/ML solution Take 110 mg by mouth in the morning.      ondansetron (ZOFRAN) 4 MG  tablet Take 1 tablet (4 mg total) by mouth every 8 (eight) hours as needed for nausea or vomiting. (Patient not taking: Reported on 05/15/2022) 30 tablet 0    No current facility-administered medications for this visit.   No Known Allergies  Social History   Tobacco Use   Smoking status: Former    Packs/day: 0.25    Years: 35.00    Additional pack years: 0.00    Total pack years: 8.75    Types: Cigarettes    Quit date: 01/21/2022    Years since quitting: 0.3   Smokeless tobacco: Never  Substance Use Topics   Alcohol use: Not Currently    Family History  Family history unknown: Yes     Review of Systems  Musculoskeletal:  Positive for arthralgias and gait problem.  All other systems reviewed and are negative.   Objective:  Physical Exam Constitutional:      Appearance: Normal appearance.  HENT:     Head: Normocephalic and atraumatic.     Nose: Nose normal.     Mouth/Throat:     Mouth: Mucous membranes are moist.     Pharynx: Oropharynx is clear.  Eyes:     Conjunctiva/sclera: Conjunctivae normal.  Cardiovascular:     Rate and Rhythm: Normal rate and regular rhythm.     Pulses: Normal pulses.     Heart sounds: Normal heart sounds.  Pulmonary:     Effort: Pulmonary effort is normal.     Breath sounds: Normal breath sounds.  Abdominal:  General: Abdomen is flat.     Palpations: Abdomen is soft.  Genitourinary:    Comments: deferred Musculoskeletal:     Cervical back: Normal range of motion and neck supple.     Comments: Examination of her right hip reveals no skin wounds or lesions. She has severe motion restriction with flexion contracture and pain with movement right. She has no internal or external rotation. Mild trochanteric tenderness to palpation.   Sensory and motor function intact in LE bilaterally. Distal pedal pulses 2+ bilaterally.  Skin:    General: Skin is warm and dry.     Capillary Refill: Capillary refill takes less than 2 seconds.   Neurological:     General: No focal deficit present.     Mental Status: She is alert and oriented to person, place, and time.  Psychiatric:        Mood and Affect: Mood normal.        Behavior: Behavior normal.        Thought Content: Thought content normal.        Judgment: Judgment normal.     Vital signs in last 24 hours: @VSRANGES @  Labs:   Estimated body mass index is 24.37 kg/m as calculated from the following:   Height as of 05/18/22: 5\' 1"  (1.549 m).   Weight as of 05/18/22: 58.5 kg.   Imaging Review Plain radiographs demonstrate severe degenerative joint disease of the right hip(s). The bone quality appears to be adequate for age and reported activity level.      Assessment/Plan:  End stage arthritis, right hip(s)  The patient history, physical examination, clinical judgement of the provider and imaging studies are consistent with end stage degenerative joint disease of the right hip(s) and total hip arthroplasty is deemed medically necessary. The treatment options including medical management, injection therapy, arthroscopy and arthroplasty were discussed at length. The risks and benefits of total hip arthroplasty were presented and reviewed. The risks due to aseptic loosening, infection, stiffness, dislocation/subluxation,  thromboembolic complications and other imponderables were discussed.  The patient acknowledged the explanation, agreed to proceed with the plan and consent was signed. Patient is being admitted for inpatient treatment for surgery, pain control, PT, OT, prophylactic antibiotics, VTE prophylaxis, progressive ambulation and ADL's and discharge planning.The patient is planning to be discharged home with HEP after an overnight stay.   Therapy Plans: HEP. Discussed the importance of hip abductor strengthening exercises including 150-300 standing side leg raises per day. Also, non-pounding exercises with a stationary bike/elliptical for 30 minutes  3x/week. Disposition: Home with daughter.  Planned DVT Prophylaxis: aspirin 81mg  BID.  DME needed: Has rolling walker. Order 3-in-1 commode, printed copy provided to patient today.  PCP: Cleared.  TXA: IV Allergies: NDKA.  Anesthesia Concerns: None.  BMI: 24.9 Last HgbA1c: Not diabetic Other: - History of substance abuse, on methadone. We discussed we would take over her pain management for 2 weeks postoperatively then they would take back over.  - She reports she quit smoking before last surgery.  - Hydrocodone, zofran, methocarbamol.  - 05/18/22: Hgb 9.6, K+ 3.7, Cr. 0.71.    Patient's anticipated LOS is less than 2 midnights, meeting these requirements: - Younger than 6 - Lives within 1 hour of care - Has a competent adult at home to recover with post-op recover - NO history of  - Chronic pain requiring opiods  - Diabetes  - Coronary Artery Disease  - Heart failure  - Heart attack  - Stroke  -  DVT/VTE  - Cardiac arrhythmia  - Respiratory Failure/COPD  - Renal failure  - Anemia  - Advanced Liver disease

## 2022-05-26 NOTE — H&P (Signed)
TOTAL HIP ADMISSION H&P  Patient is admitted for right total hip arthroplasty.  Subjective:  Chief Complaint: right hip pain  HPI: Kara Fox, 67 y.o. female, has a history of pain and functional disability in the right hip(s) due to arthritis and patient has failed non-surgical conservative treatments for greater than 12 weeks to include NSAID's and/or analgesics, flexibility and strengthening excercises, use of assistive devices, and activity modification.  Onset of symptoms was gradual starting 10 years ago with rapidlly worsening course since that time.The patient noted no past surgery on the right hip(s).  Patient currently rates pain in the right hip at 10 out of 10 with activity. Patient has night pain, worsening of pain with activity and weight bearing, trendelenberg gait, pain that interfers with activities of daily living, and pain with passive range of motion. Patient has evidence of subchondral cysts, subchondral sclerosis, periarticular osteophytes, and joint space narrowing by imaging studies. This condition presents safety issues increasing the risk of falls.  There is no current active infection.  Patient Active Problem List   Diagnosis Date Noted   Osteoarthritis of left hip 04/09/2022   Past Medical History:  Diagnosis Date   Arthritis    Asthma    childhood   Hepatitis    Hypertension     Past Surgical History:  Procedure Laterality Date   CESAREAN SECTION     HERNIA REPAIR     TOTAL HIP ARTHROPLASTY Left 04/09/2022   Procedure: TOTAL HIP ARTHROPLASTY ANTERIOR APPROACH;  Surgeon: Rod Can, MD;  Location: [facility];  Service: Orthopedics;  Laterality: Left;  125    Current Outpatient Medications  Medication Sig Dispense Refill Last Dose   ibuprofen (ADVIL) 200 MG tablet Take 200-400 mg by mouth every 6 (six) hours as needed for moderate pain.      methadone (DOLOPHINE) 10 MG/ML solution Take 110 mg by mouth in the morning.      ondansetron (ZOFRAN) 4 MG  tablet Take 1 tablet (4 mg total) by mouth every 8 (eight) hours as needed for nausea or vomiting. (Patient not taking: Reported on 05/15/2022) 30 tablet 0    No current facility-administered medications for this visit.   No Known Allergies  Social History   Tobacco Use   Smoking status: Former    Packs/day: 0.25    Years: 35.00    Additional pack years: 0.00    Total pack years: 8.75    Types: Cigarettes    Quit date: 01/21/2022    Years since quitting: 0.3   Smokeless tobacco: Never  Substance Use Topics   Alcohol use: Not Currently    Family History  Family history unknown: Yes     Review of Systems  Musculoskeletal:  Positive for arthralgias and gait problem.  All other systems reviewed and are negative.   Objective:  Physical Exam Constitutional:      Appearance: Normal appearance.  HENT:     Head: Normocephalic and atraumatic.     Nose: Nose normal.     Mouth/Throat:     Mouth: Mucous membranes are moist.     Pharynx: Oropharynx is clear.  Eyes:     Conjunctiva/sclera: Conjunctivae normal.  Cardiovascular:     Rate and Rhythm: Normal rate and regular rhythm.     Pulses: Normal pulses.     Heart sounds: Normal heart sounds.  Pulmonary:     Effort: Pulmonary effort is normal.     Breath sounds: Normal breath sounds.  Abdominal:  General: Abdomen is flat.     Palpations: Abdomen is soft.  Genitourinary:    Comments: deferred Musculoskeletal:     Cervical back: Normal range of motion and neck supple.     Comments: Examination of her right hip reveals no skin wounds or lesions. She has severe motion restriction with flexion contracture and pain with movement right. She has no internal or external rotation. Mild trochanteric tenderness to palpation.   Sensory and motor function intact in LE bilaterally. Distal pedal pulses 2+ bilaterally.  Skin:    General: Skin is warm and dry.     Capillary Refill: Capillary refill takes less than 2 seconds.   Neurological:     General: No focal deficit present.     Mental Status: She is alert and oriented to person, place, and time.  Psychiatric:        Mood and Affect: Mood normal.        Behavior: Behavior normal.        Thought Content: Thought content normal.        Judgment: Judgment normal.     Vital signs in last 24 hours: @VSRANGES @  Labs:   Estimated body mass index is 24.37 kg/m as calculated from the following:   Height as of 05/18/22: 5\' 1"  (1.549 m).   Weight as of 05/18/22: 58.5 kg.   Imaging Review Plain radiographs demonstrate severe degenerative joint disease of the right hip(s). The bone quality appears to be adequate for age and reported activity level.      Assessment/Plan:  End stage arthritis, right hip(s)  The patient history, physical examination, clinical judgement of the provider and imaging studies are consistent with end stage degenerative joint disease of the right hip(s) and total hip arthroplasty is deemed medically necessary. The treatment options including medical management, injection therapy, arthroscopy and arthroplasty were discussed at length. The risks and benefits of total hip arthroplasty were presented and reviewed. The risks due to aseptic loosening, infection, stiffness, dislocation/subluxation,  thromboembolic complications and other imponderables were discussed.  The patient acknowledged the explanation, agreed to proceed with the plan and consent was signed. Patient is being admitted for inpatient treatment for surgery, pain control, PT, OT, prophylactic antibiotics, VTE prophylaxis, progressive ambulation and ADL's and discharge planning.The patient is planning to be discharged home with HEP after an overnight stay.   Therapy Plans: HEP. Discussed the importance of hip abductor strengthening exercises including 150-300 standing side leg raises per day. Also, non-pounding exercises with a stationary bike/elliptical for 30 minutes  3x/week. Disposition: Home with daughter.  Planned DVT Prophylaxis: aspirin 81mg  BID.  DME needed: Has rolling walker. Order 3-in-1 commode, printed copy provided to patient today.  PCP: Cleared.  TXA: IV Allergies: NDKA.  Anesthesia Concerns: None.  BMI: 24.9 Last HgbA1c: Not diabetic Other: - History of substance abuse, on methadone. We discussed we would take over her pain management for 2 weeks postoperatively then they would take back over.  - She reports she quit smoking before last surgery.  - Hydrocodone, zofran, methocarbamol.  - 05/18/22: Hgb 9.6, K+ 3.7, Cr. 0.71.    Patient's anticipated LOS is less than 2 midnights, meeting these requirements: - Younger than 84 - Lives within 1 hour of care - Has a competent adult at home to recover with post-op recover - NO history of  - Chronic pain requiring opiods  - Diabetes  - Coronary Artery Disease  - Heart failure  - Heart attack  - Stroke  -  DVT/VTE  - Cardiac arrhythmia  - Respiratory Failure/COPD  - Renal failure  - Anemia  - Advanced Liver disease

## 2022-05-27 ENCOUNTER — Encounter (HOSPITAL_COMMUNITY): Payer: Self-pay | Admitting: Orthopedic Surgery

## 2022-05-27 ENCOUNTER — Ambulatory Visit (HOSPITAL_COMMUNITY)
Admission: RE | Admit: 2022-05-27 | Discharge: 2022-05-28 | Disposition: A | Discharge status: Home / Self Care | Payer: Medicaid Other | Source: Ambulatory Visit | Attending: Orthopedic Surgery | Admitting: Orthopedic Surgery

## 2022-05-27 ENCOUNTER — Ambulatory Visit (HOSPITAL_COMMUNITY): Payer: Medicaid Other | Admitting: Physician Assistant

## 2022-05-27 ENCOUNTER — Other Ambulatory Visit: Payer: Self-pay

## 2022-05-27 ENCOUNTER — Ambulatory Visit (HOSPITAL_BASED_OUTPATIENT_CLINIC_OR_DEPARTMENT_OTHER): Payer: Medicaid Other | Admitting: Certified Registered"

## 2022-05-27 ENCOUNTER — Ambulatory Visit (HOSPITAL_COMMUNITY): Payer: Medicaid Other

## 2022-05-27 ENCOUNTER — Encounter (HOSPITAL_COMMUNITY)
Admission: RE | Disposition: A | Discharge status: Home / Self Care | Payer: Self-pay | Source: Ambulatory Visit | Attending: Orthopedic Surgery

## 2022-05-27 DIAGNOSIS — I10 Essential (primary) hypertension: Secondary | ICD-10-CM | POA: Diagnosis not present

## 2022-05-27 DIAGNOSIS — M1611 Unilateral primary osteoarthritis, right hip: Secondary | ICD-10-CM | POA: Diagnosis not present

## 2022-05-27 DIAGNOSIS — Z96641 Presence of right artificial hip joint: Secondary | ICD-10-CM

## 2022-05-27 DIAGNOSIS — Z87891 Personal history of nicotine dependence: Secondary | ICD-10-CM | POA: Diagnosis not present

## 2022-05-27 HISTORY — PX: TOTAL HIP ARTHROPLASTY: SHX124

## 2022-05-27 SURGERY — ARTHROPLASTY, HIP, TOTAL, ANTERIOR APPROACH
Anesthesia: Spinal | Site: Hip | Laterality: Right

## 2022-05-27 MED ORDER — METOCLOPRAMIDE HCL 5 MG/ML IJ SOLN: 5.0000 mg | Freq: Three times a day (TID) | INTRAMUSCULAR | Status: DC | PRN

## 2022-05-27 MED ORDER — TRANEXAMIC ACID-NACL 1000-0.7 MG/100ML-% IV SOLN
1000.0000 mg | INTRAVENOUS | Status: AC
  Administered 2022-05-27: 1000 mg via INTRAVENOUS
  Filled 2022-05-27: qty 100

## 2022-05-27 MED ORDER — DIPHENHYDRAMINE HCL 12.5 MG/5ML PO ELIX: 12.5000 mg | ORAL_SOLUTION | ORAL | Status: DC | PRN

## 2022-05-27 MED ORDER — SODIUM CHLORIDE 0.9 % IR SOLN
Status: DC | PRN
  Administered 2022-05-27: 1000 mL
  Administered 2022-05-27: 3000 mL

## 2022-05-27 MED ORDER — CEFAZOLIN SODIUM-DEXTROSE 2-4 GM/100ML-% IV SOLN
2.0000 g | Freq: Four times a day (QID) | INTRAVENOUS | Status: AC
  Administered 2022-05-27 – 2022-05-28 (×2): 2 g via INTRAVENOUS
  Filled 2022-05-27 (×2): qty 100

## 2022-05-27 MED ORDER — BUPIVACAINE IN DEXTROSE 0.75-8.25 % IT SOLN
INTRATHECAL | Status: DC | PRN
  Administered 2022-05-27: 1.6 mL via INTRATHECAL

## 2022-05-27 MED ORDER — LACTATED RINGERS IV SOLN
INTRAVENOUS | Status: DC
  Administered 2022-05-27: 1000 mL via INTRAVENOUS

## 2022-05-27 MED ORDER — HYDROCODONE-ACETAMINOPHEN 7.5-325 MG PO TABS: 1.0000 | ORAL_TABLET | ORAL | Status: DC | PRN

## 2022-05-27 MED ORDER — ONDANSETRON HCL 4 MG/2ML IJ SOLN
INTRAMUSCULAR | Status: DC | PRN
  Administered 2022-05-27: 4 mg via INTRAVENOUS

## 2022-05-27 MED ORDER — METHOCARBAMOL 500 MG PO TABS
500.0000 mg | ORAL_TABLET | Freq: Four times a day (QID) | ORAL | Status: DC | PRN
  Administered 2022-05-27: 500 mg via ORAL
  Filled 2022-05-27: qty 1

## 2022-05-27 MED ORDER — SODIUM CHLORIDE (PF) 0.9 % IJ SOLN
INTRAMUSCULAR | Status: AC
  Filled 2022-05-27: qty 50

## 2022-05-27 MED ORDER — ACETAMINOPHEN 500 MG PO TABS
1000.0000 mg | ORAL_TABLET | Freq: Once | ORAL | Status: AC
  Administered 2022-05-27: 1000 mg via ORAL
  Filled 2022-05-27: qty 2

## 2022-05-27 MED ORDER — SODIUM CHLORIDE 0.9 % IV SOLN: INTRAVENOUS | Status: DC

## 2022-05-27 MED ORDER — OXYCODONE HCL 5 MG/5ML PO SOLN: 5.0000 mg | Freq: Once | ORAL | Status: DC | PRN

## 2022-05-27 MED ORDER — POVIDONE-IODINE 10 % EX SWAB
2.0000 | Freq: Once | CUTANEOUS | Status: AC
  Administered 2022-05-27: 2 via TOPICAL

## 2022-05-27 MED ORDER — METHADONE HCL 10 MG/ML PO CONC
110.0000 mg | Freq: Every morning | ORAL | Status: DC
  Administered 2022-05-28: 110 mg via ORAL
  Filled 2022-05-27 (×2): qty 15

## 2022-05-27 MED ORDER — FENTANYL CITRATE (PF) 100 MCG/2ML IJ SOLN
INTRAMUSCULAR | Status: DC | PRN
  Administered 2022-05-27 (×2): 50 ug via INTRAVENOUS

## 2022-05-27 MED ORDER — KETOROLAC TROMETHAMINE 30 MG/ML IJ SOLN
INTRAMUSCULAR | Status: DC | PRN
  Administered 2022-05-27: 30 mg

## 2022-05-27 MED ORDER — ORAL CARE MOUTH RINSE: 15.0000 mL | Freq: Once | OROMUCOSAL | Status: AC

## 2022-05-27 MED ORDER — ALUM & MAG HYDROXIDE-SIMETH 200-200-20 MG/5ML PO SUSP: 30.0000 mL | ORAL | Status: DC | PRN

## 2022-05-27 MED ORDER — BUPIVACAINE-EPINEPHRINE 0.25% -1:200000 IJ SOLN
INTRAMUSCULAR | Status: DC | PRN
  Administered 2022-05-27: 30 mL

## 2022-05-27 MED ORDER — DEXAMETHASONE SODIUM PHOSPHATE 10 MG/ML IJ SOLN
INTRAMUSCULAR | Status: AC
  Filled 2022-05-27: qty 1

## 2022-05-27 MED ORDER — ACETAMINOPHEN 325 MG PO TABS: 325.0000 mg | ORAL_TABLET | Freq: Four times a day (QID) | ORAL | Status: DC | PRN

## 2022-05-27 MED ORDER — PROPOFOL 1000 MG/100ML IV EMUL
INTRAVENOUS | Status: AC
  Filled 2022-05-27: qty 100

## 2022-05-27 MED ORDER — DEXAMETHASONE SODIUM PHOSPHATE 10 MG/ML IJ SOLN
8.0000 mg | Freq: Once | INTRAMUSCULAR | Status: AC
  Administered 2022-05-27: 10 mg via INTRAVENOUS

## 2022-05-27 MED ORDER — PROMETHAZINE HCL 25 MG/ML IJ SOLN: 6.2500 mg | INTRAMUSCULAR | Status: DC | PRN

## 2022-05-27 MED ORDER — METHOCARBAMOL 1000 MG/10ML IJ SOLN: 500.0000 mg | Freq: Four times a day (QID) | INTRAVENOUS | Status: DC | PRN

## 2022-05-27 MED ORDER — POLYETHYLENE GLYCOL 3350 17 G PO PACK: 17.0000 g | PACK | Freq: Every day | ORAL | Status: DC | PRN

## 2022-05-27 MED ORDER — DOCUSATE SODIUM 100 MG PO CAPS
100.0000 mg | ORAL_CAPSULE | Freq: Two times a day (BID) | ORAL | Status: DC
  Administered 2022-05-27 – 2022-05-28 (×2): 100 mg via ORAL
  Filled 2022-05-27 (×2): qty 1

## 2022-05-27 MED ORDER — PROPOFOL 10 MG/ML IV BOLUS
INTRAVENOUS | Status: DC | PRN
  Administered 2022-05-27: 20 mg via INTRAVENOUS
  Administered 2022-05-27: 30 mg via INTRAVENOUS
  Administered 2022-05-27: 20 mg via INTRAVENOUS

## 2022-05-27 MED ORDER — SODIUM CHLORIDE (PF) 0.9 % IJ SOLN
INTRAMUSCULAR | Status: DC | PRN
  Administered 2022-05-27: 30 mL

## 2022-05-27 MED ORDER — SENNA 8.6 MG PO TABS
1.0000 | ORAL_TABLET | Freq: Two times a day (BID) | ORAL | Status: DC
  Administered 2022-05-27 – 2022-05-28 (×2): 8.6 mg via ORAL
  Filled 2022-05-27 (×2): qty 1

## 2022-05-27 MED ORDER — BUPIVACAINE-EPINEPHRINE (PF) 0.25% -1:200000 IJ SOLN
INTRAMUSCULAR | Status: AC
  Filled 2022-05-27: qty 30

## 2022-05-27 MED ORDER — PHENYLEPHRINE HCL-NACL 20-0.9 MG/250ML-% IV SOLN
INTRAVENOUS | Status: DC | PRN
  Administered 2022-05-27: 50 ug/min via INTRAVENOUS

## 2022-05-27 MED ORDER — LACTATED RINGERS IV SOLN: INTRAVENOUS | Status: DC

## 2022-05-27 MED ORDER — METOCLOPRAMIDE HCL 5 MG PO TABS: 5.0000 mg | ORAL_TABLET | Freq: Three times a day (TID) | ORAL | Status: DC | PRN

## 2022-05-27 MED ORDER — MIDAZOLAM HCL 5 MG/5ML IJ SOLN
INTRAMUSCULAR | Status: DC | PRN
  Administered 2022-05-27: 2 mg via INTRAVENOUS

## 2022-05-27 MED ORDER — MENTHOL 3 MG MT LOZG: 1.0000 | LOZENGE | OROMUCOSAL | Status: DC | PRN

## 2022-05-27 MED ORDER — CELECOXIB 200 MG PO CAPS
200.0000 mg | ORAL_CAPSULE | Freq: Two times a day (BID) | ORAL | Status: DC
  Administered 2022-05-27 – 2022-05-28 (×2): 200 mg via ORAL
  Filled 2022-05-27 (×2): qty 1

## 2022-05-27 MED ORDER — ISOPROPYL ALCOHOL 70 % SOLN
Status: DC | PRN
  Administered 2022-05-27: 1 via TOPICAL

## 2022-05-27 MED ORDER — MIDAZOLAM HCL 2 MG/2ML IJ SOLN
INTRAMUSCULAR | Status: AC
  Filled 2022-05-27: qty 2

## 2022-05-27 MED ORDER — CHLORHEXIDINE GLUCONATE 0.12 % MT SOLN
15.0000 mL | Freq: Once | OROMUCOSAL | Status: AC
  Administered 2022-05-27: 15 mL via OROMUCOSAL

## 2022-05-27 MED ORDER — HYDROCODONE-ACETAMINOPHEN 5-325 MG PO TABS
1.0000 | ORAL_TABLET | ORAL | Status: DC | PRN
  Administered 2022-05-27 – 2022-05-28 (×4): 2 via ORAL
  Filled 2022-05-27 (×4): qty 2

## 2022-05-27 MED ORDER — KETOROLAC TROMETHAMINE 30 MG/ML IJ SOLN
INTRAMUSCULAR | Status: AC
  Filled 2022-05-27: qty 1

## 2022-05-27 MED ORDER — HYDROMORPHONE HCL 1 MG/ML IJ SOLN: 0.2500 mg | INTRAMUSCULAR | Status: DC | PRN

## 2022-05-27 MED ORDER — PROPOFOL 500 MG/50ML IV EMUL
INTRAVENOUS | Status: DC | PRN
  Administered 2022-05-27: 30 ug/kg/min via INTRAVENOUS

## 2022-05-27 MED ORDER — ONDANSETRON HCL 4 MG/2ML IJ SOLN
INTRAMUSCULAR | Status: AC
  Filled 2022-05-27: qty 2

## 2022-05-27 MED ORDER — WATER FOR IRRIGATION, STERILE IR SOLN
Status: DC | PRN
  Administered 2022-05-27: 2000 mL

## 2022-05-27 MED ORDER — CEFAZOLIN SODIUM-DEXTROSE 2-4 GM/100ML-% IV SOLN
2.0000 g | INTRAVENOUS | Status: AC
  Administered 2022-05-27: 2 g via INTRAVENOUS
  Filled 2022-05-27: qty 100

## 2022-05-27 MED ORDER — METHADONE HCL 10 MG/ML PO CONC
110.0000 mg | Freq: Once | ORAL | Status: AC
  Administered 2022-05-27: 110 mg via ORAL
  Filled 2022-05-27: qty 15

## 2022-05-27 MED ORDER — PHENOL 1.4 % MT LIQD: 1.0000 | OROMUCOSAL | Status: DC | PRN

## 2022-05-27 MED ORDER — MORPHINE SULFATE (PF) 2 MG/ML IV SOLN: 0.5000 mg | INTRAVENOUS | Status: DC | PRN

## 2022-05-27 MED ORDER — ASPIRIN 81 MG PO CHEW
81.0000 mg | CHEWABLE_TABLET | Freq: Two times a day (BID) | ORAL | Status: DC
  Administered 2022-05-28: 81 mg via ORAL
  Filled 2022-05-27: qty 1

## 2022-05-27 MED ORDER — ONDANSETRON HCL 4 MG/2ML IJ SOLN: 4.0000 mg | Freq: Four times a day (QID) | INTRAMUSCULAR | Status: DC | PRN

## 2022-05-27 MED ORDER — FENTANYL CITRATE (PF) 100 MCG/2ML IJ SOLN
INTRAMUSCULAR | Status: AC
  Filled 2022-05-27: qty 2

## 2022-05-27 MED ORDER — OXYCODONE HCL 5 MG PO TABS: 5.0000 mg | ORAL_TABLET | Freq: Once | ORAL | Status: DC | PRN

## 2022-05-27 MED ORDER — ONDANSETRON HCL 4 MG PO TABS: 4.0000 mg | ORAL_TABLET | Freq: Four times a day (QID) | ORAL | Status: DC | PRN

## 2022-05-27 SURGICAL SUPPLY — 51 items
ACE SHELL 54 4H HIP (Shell) ×1 IMPLANT
ADH SKN CLS APL DERMABOND .7 (GAUZE/BANDAGES/DRESSINGS) ×2
APL PRP STRL LF DISP 70% ISPRP (MISCELLANEOUS) ×1
CHLORAPREP W/TINT 26 (MISCELLANEOUS) ×1 IMPLANT
COVER PERINEAL POST (MISCELLANEOUS) ×1 IMPLANT
COVER SURGICAL LIGHT HANDLE (MISCELLANEOUS) ×1 IMPLANT
DERMABOND ADVANCED .7 DNX12 (GAUZE/BANDAGES/DRESSINGS) ×2 IMPLANT
DRAPE IMP U-DRAPE 54X76 (DRAPES) ×1 IMPLANT
DRAPE SHEET LG 3/4 BI-LAMINATE (DRAPES) ×3 IMPLANT
DRAPE STERI IOBAN 125X83 (DRAPES) ×1 IMPLANT
DRAPE U-SHAPE 47X51 STRL (DRAPES) ×2 IMPLANT
DRSG AQUACEL AG ADV 3.5X10 (GAUZE/BANDAGES/DRESSINGS) ×1 IMPLANT
ELECT REM PT RETURN 15FT ADLT (MISCELLANEOUS) ×1 IMPLANT
GAUZE SPONGE 4X4 12PLY STRL (GAUZE/BANDAGES/DRESSINGS) ×1 IMPLANT
GLOVE BIO SURGEON STRL SZ7 (GLOVE) ×1 IMPLANT
GLOVE BIO SURGEON STRL SZ8.5 (GLOVE) ×2 IMPLANT
GLOVE BIOGEL PI IND STRL 7.5 (GLOVE) ×1 IMPLANT
GLOVE BIOGEL PI IND STRL 8.5 (GLOVE) ×1 IMPLANT
GOWN SPEC L3 XXLG W/TWL (GOWN DISPOSABLE) ×1 IMPLANT
GOWN STRL REUS W/ TWL XL LVL3 (GOWN DISPOSABLE) ×1 IMPLANT
GOWN STRL REUS W/TWL XL LVL3 (GOWN DISPOSABLE) ×1
HANDPIECE INTERPULSE COAX TIP (DISPOSABLE) ×1
HEAD CERAMIC BIOLOX 36 (Head) IMPLANT
HOLDER FOLEY CATH W/STRAP (MISCELLANEOUS) ×1 IMPLANT
HOOD PEEL AWAY T7 (MISCELLANEOUS) ×3 IMPLANT
KIT TURNOVER KIT A (KITS) IMPLANT
LINER ACETAB NN G7 F 36 (Liner) IMPLANT
MANIFOLD NEPTUNE II (INSTRUMENTS) ×1 IMPLANT
MARKER SKIN DUAL TIP RULER LAB (MISCELLANEOUS) ×1 IMPLANT
NDL SAFETY ECLIP 18X1.5 (MISCELLANEOUS) ×1 IMPLANT
NDL SPNL 18GX3.5 QUINCKE PK (NEEDLE) ×1 IMPLANT
NEEDLE SPNL 18GX3.5 QUINCKE PK (NEEDLE) ×1 IMPLANT
PACK ANTERIOR HIP CUSTOM (KITS) ×1 IMPLANT
SAW OSC TIP CART 19.5X105X1.3 (SAW) ×1 IMPLANT
SEALER BIPOLAR AQUA 6.0 (INSTRUMENTS) ×1 IMPLANT
SET HNDPC FAN SPRY TIP SCT (DISPOSABLE) ×1 IMPLANT
SHELL ACETAB 54 4H HIP (Shell) IMPLANT
SOLUTION PRONTOSAN WOUND 350ML (IRRIGATION / IRRIGATOR) ×1 IMPLANT
SPIKE FLUID TRANSFER (MISCELLANEOUS) ×1 IMPLANT
STEM FEM STD HIP 12X175 (Stem) IMPLANT
SUT MNCRL AB 3-0 PS2 18 (SUTURE) ×1 IMPLANT
SUT MON AB 2-0 CT1 36 (SUTURE) ×1 IMPLANT
SUT STRATAFIX PDO 1 14 VIOLET (SUTURE) ×1
SUT STRATFX PDO 1 14 VIOLET (SUTURE) ×1
SUT VIC AB 2-0 CT1 27 (SUTURE)
SUT VIC AB 2-0 CT1 TAPERPNT 27 (SUTURE) IMPLANT
SUTURE STRATFX PDO 1 14 VIOLET (SUTURE) ×1 IMPLANT
SYR 3ML LL SCALE MARK (SYRINGE) ×1 IMPLANT
TRAY FOLEY MTR SLVR 16FR STAT (SET/KITS/TRAYS/PACK) IMPLANT
TUBE SUCTION HIGH CAP CLEAR NV (SUCTIONS) ×1 IMPLANT
WATER STERILE IRR 1000ML POUR (IV SOLUTION) ×1 IMPLANT

## 2022-05-27 NOTE — Progress Notes (Addendum)
Postop xrays reviewed. Patient has [employer] ppx femur fracture. No further surgery indicated; stem is stable. She can WBAT with walker; no active abduction for 6 weeks. Discussed with the patient, who expresses understanding.

## 2022-05-27 NOTE — Anesthesia Procedure Notes (Signed)
Spinal  Patient location during procedure: OR End time: 05/27/2022 12:36 PM Reason for block: surgical anesthesia Staffing Performed: resident/CRNA  Resident/CRNA: Marijo Conception, CRNA Performed by: Raziyah Vanvleck, Clinical cytogeneticist D, CRNA Authorized by: Lynda Rainwater, MD   Preanesthetic Checklist Completed: patient identified, IV checked, site marked, risks and benefits discussed, surgical consent, monitors and equipment checked, pre-op evaluation and timeout performed Spinal Block Patient position: sitting Prep: DuraPrep Patient monitoring: heart rate, continuous pulse ox and blood pressure Approach: midline Location: L3-4 Injection technique: single-shot Needle Needle type: Pencan  Needle gauge: 24 G Needle length: 9 cm Assessment Sensory level: T6 Events: CSF return

## 2022-05-27 NOTE — Transfer of Care (Signed)
Immediate Anesthesia Transfer of Care Note  Patient: Kara Fox  Procedure(s) Performed: TOTAL HIP ARTHROPLASTY ANTERIOR APPROACH (Right: Hip)  Patient Location: PACU  Anesthesia Type:MAC and Spinal  Level of Consciousness: awake, oriented, and patient cooperative  Airway & Oxygen Therapy: Patient Spontanous Breathing and Patient connected to face mask oxygen  Post-op Assessment: Report given to RN and Post -op Vital signs reviewed and stable  Post vital signs: Reviewed  Last Vitals:  Vitals Value Taken Time  BP 136/79 05/27/22 1527  Temp    Pulse 65 05/27/22 1530  Resp    SpO2 100 % 05/27/22 1530  Vitals shown include unvalidated device data.  Last Pain:  Vitals:   05/27/22 1022  TempSrc:   PainSc: 0-No pain         Complications: No notable events documented.

## 2022-05-27 NOTE — Op Note (Signed)
OPERATIVE REPORT  SURGEON: Rod Can, MD   ASSISTANT: Larene Pickett, PA-C.  PREOPERATIVE DIAGNOSIS: Right hip arthritis.   POSTOPERATIVE DIAGNOSIS: Right hip arthritis.   PROCEDURE: Right total hip arthroplasty, anterior approach.   IMPLANTS: [employer] Arcos One-Piece stem, size 12 x 175 mm, standard offset. Biomet G7 OsseoTi Cup, size 54 mm. Biomet Vivacit-E liner, size 36 mm, F,  neutral. Biomet Biolox ceramic head ball, size 36 - 3 mm.  ANESTHESIA:  MAC and Spinal  ESTIMATED BLOOD LOSS:-300 mL    ANTIBIOTICS: 2 g Ancef.  DRAINS: None.  COMPLICATIONS: None.   CONDITION: PACU - hemodynamically stable.   BRIEF CLINICAL NOTE: Kara Fox is a 66 y.o. female with a long-standing history of Right hip arthritis. After failing conservative management, the patient was indicated for total hip arthroplasty. The risks, benefits, and alternatives to the procedure were explained, and the patient elected to proceed.  PROCEDURE IN DETAIL: Surgical site was marked by myself in the pre-op holding area. Once inside the operating room, spinal anesthesia was obtained, and a foley catheter was inserted. The patient was then positioned on the Hana table.  All bony prominences were well padded.  The hip was prepped and draped in the normal sterile surgical fashion.  A time-out was called verifying side and site of surgery. The patient received IV antibiotics within 60 minutes of beginning the procedure.   Bikini incision was made, and superficial dissection was performed lateral to the ASIS. The direct anterior approach to the hip was performed through the Hueter interval.  Lateral femoral circumflex vessels were treated with the Auqumantys. The anterior capsule was exposed and an inverted T capsulotomy was made. The femoral neck cut was made to the level of the templated cut.  A precision saw was utilized due to minimally invasive approach. A corkscrew was placed into the head and the  head was removed.  The femoral head was found to have eburnated bone. The head was passed to the back table and was measured. Pubofemoral ligament was released off of the calcar, taking care to stay on bone. Superior capsule was released from the greater trochanter, taking care to stay lateral to the posterior border of the femoral neck in order to preserve the short external rotators.   Acetabular exposure was achieved, and the pulvinar and labrum were excised. Sequential reaming of the acetabulum was then performed up to a size 53 mm reamer. A 54 mm cup was then opened and impacted into place at approximately 40 degrees of abduction and 20 degrees of anteversion. The final polyethylene liner was impacted into place and acetabular osteophytes were removed.    I then gained femoral exposure taking care to protect the abductors and greater trochanter.  This was performed using standard external rotation, extension, and adduction.  A cookie cutter was used to enter the femoral canal, and then the femoral canal finder was placed.  Due to poor proximal femoral bone quality, I elected to proceed with placing a distal fixation implant rather than a flat taper wedge like we used on the other side. I sequentially reamed up to 12 mm. Sequential broaching was performed up to a size 12.  Calcar planer was used on the femoral neck remnant.  I placed a standard offset neck and a trial head ball.  The hip was reduced.  Leg lengths and offset were checked fluoroscopically.  The hip was dislocated and trial components were removed.  The final implants were placed, and the hip was  reduced.  Fluoroscopy was used to confirm component position and leg lengths.  At 90 degrees of external rotation and full extension, the hip was stable to an anterior directed force.   Meticulous hemostasis was obtained with the Aquamantys due to complex case requiring increased OR time and/or exposure. The wound was copiously irrigated with  Prontosan solution and normal saline using pulse lavage.  Marcaine solution was injected into the periarticular soft tissue.  The wound was closed in layers using #1 Vicryl and V-Loc for the fascia, 2-0 Vicryl for the subcutaneous fat, 2-0 Monocryl for the deep dermal layer, 3-0 running Monocryl subcuticular stitch, and Dermabond for the skin.  Once the glue was fully dried, an Aquacell Ag dressing was applied.  The patient was transported to the recovery room in stable condition.  Sponge, needle, and instrument counts were correct at the end of the case x2.  The patient tolerated the procedure well and there were no known complications.  Please note that a surgical assistant was a medical necessity for this procedure to perform it in a safe and expeditious manner. Assistant was necessary to provide appropriate retraction of vital neurovascular structures, to prevent femoral fracture, and to allow for anatomic placement of the prosthesis.

## 2022-05-27 NOTE — Anesthesia Preprocedure Evaluation (Signed)
Anesthesia Evaluation  Patient identified by MRN, date of birth, ID band Patient awake    Reviewed: Allergy & Precautions, NPO status , Patient's Chart, lab work & pertinent test results  Airway Mallampati: I  TM Distance: >3 FB Neck ROM: Full    Dental  (+) Partial Lower, Partial Upper, Dental Advisory Given   Pulmonary asthma , former smoker   breath sounds clear to auscultation       Cardiovascular hypertension, Pt. on medications  Rhythm:Regular Rate:Normal     Neuro/Psych negative neurological ROS  negative psych ROS   GI/Hepatic negative GI ROS,,,(+) Hepatitis -  Endo/Other  negative endocrine ROS    Renal/GU negative Renal ROS  negative genitourinary   Musculoskeletal  (+) Arthritis , Osteoarthritis,  narcotic dependent  Abdominal   Peds  Hematology negative hematology ROS (+)   Anesthesia Other Findings   Reproductive/Obstetrics                             Anesthesia Physical Anesthesia Plan  ASA: 2  Anesthesia Plan: Spinal   Post-op Pain Management: Tylenol PO (pre-op)*   Induction:   PONV Risk Score and Plan: 2 and Treatment may vary due to age or medical condition, Midazolam, Propofol infusion and Ondansetron  Airway Management Planned: Natural Airway and Simple Face Mask  Additional Equipment:   Intra-op Plan:   Post-operative Plan:   Informed Consent: I have reviewed the patients History and Physical, chart, labs and discussed the procedure including the risks, benefits and alternatives for the proposed anesthesia with the patient or authorized representative who has indicated his/her understanding and acceptance.     Dental advisory given  Plan Discussed with: CRNA  Anesthesia Plan Comments:        Anesthesia Quick Evaluation

## 2022-05-27 NOTE — Plan of Care (Signed)
  Problem: Education: Goal: Knowledge of the prescribed therapeutic regimen will improve Outcome: Progressing   Problem: Pain Management: Goal: Pain level will decrease with appropriate interventions Outcome: Progressing   Problem: Activity: Goal: Ability to avoid complications of mobility impairment will improve Outcome: Progressing   

## 2022-05-27 NOTE — Discharge Instructions (Signed)
? ?Dr. Brian Swinteck ?Joint Replacement Specialist ?[facility] ?[address] ?[place], [place] [address] ?[phone] ? ? ?TOTAL HIP REPLACEMENT POSTOPERATIVE DIRECTIONS ? ? ? ?Hip Rehabilitation, Guidelines Following Surgery  ? ?WEIGHT BEARING ?Weight bearing as tolerated with assist device (walker, cane, etc) as directed, use it as long as suggested by your surgeon or therapist, typically at least 4-6 weeks. ? ?The results of a hip operation are greatly improved after range of motion and muscle strengthening exercises. Follow all safety measures which are given to protect your hip. If any of these exercises cause increased pain or swelling in your joint, decrease the amount until you are comfortable again. Then slowly increase the exercises. Call your caregiver if you have problems or questions.  ? ?HOME CARE INSTRUCTIONS  ?Most of the following instructions are designed to prevent the dislocation of your new hip.  ?Remove items at home which could result in a fall. This includes throw rugs or furniture in walking pathways.  ?Continue medications as instructed at time of discharge. ?You may have some home medications which will be placed on hold until you complete the course of blood thinner medication. ?You may start showering once you are discharged home. Do not remove your dressing. ?Do not put on socks or shoes without following the instructions of your caregivers.   ?Sit on chairs with arms. Use the chair arms to help push yourself up when arising.  ?Arrange for the use of a toilet seat elevator so you are not sitting low.  ?Walk with walker as instructed.  ?You may resume a sexual relationship in one month or when given the OK by your caregiver.  ?Use walker as long as suggested by your caregivers.  ?You may put full weight on your legs and walk as much as is comfortable. ?Avoid periods of inactivity such as sitting longer than an hour when not asleep. This helps prevent blood  clots.  ?You may return to work once you are cleared by your surgeon.  ?Do not drive a car for 6 weeks or until released by your surgeon.  ?Do not drive while taking narcotics.  ?Wear elastic stockings for two weeks following surgery during the day but you may remove then at night.  ?Make sure you keep all of your appointments after your operation with all of your doctors and caregivers. You should call the office at the above phone number and make an appointment for approximately two weeks after the date of your surgery. ?Please pick up a stool softener and laxative for home use as long as you are requiring pain medications. ?ICE to the affected hip every three hours for 30 minutes at a time and then as needed for pain and swelling. Continue to use ice on the hip for pain and swelling from surgery. You may notice swelling that will progress down to the foot and ankle.  This is normal after surgery.  Elevate the leg when you are not up walking on it.   ?It is important for you to complete the blood thinner medication as prescribed by your doctor. ?Continue to use the breathing machine which will help keep your temperature down.  It is common for your temperature to cycle up and down following surgery, especially at night when you are not up moving around and exerting yourself.  The breathing machine keeps your lungs expanded and your temperature down. ? ?RANGE OF MOTION AND STRENGTHENING EXERCISES  ?These exercises are designed to help you   keep full movement of your hip joint. Follow your caregiver's or physical therapist's instructions. Perform all exercises about fifteen times, three times per day or as directed. Exercise both hips, even if you have had only one joint replacement. These exercises can be done on a training (exercise) mat, on the floor, on a table or on a bed. Use whatever works the best and is most comfortable for you. Use music or television while you are exercising so that the exercises are a  pleasant break in your day. This will make your life better with the exercises acting as a break in routine you can look forward to.  ?Lying on your back, slowly slide your foot toward your buttocks, raising your knee up off the floor. Then slowly slide your foot back down until your leg is straight again.  ?Lying on your back spread your legs as far apart as you can without causing discomfort.  ?Lying on your side, raise your upper leg and foot straight up from the floor as far as is comfortable. Slowly lower the leg and repeat.  ?Lying on your back, tighten up the muscle in the front of your thigh (quadriceps muscles). You can do this by keeping your leg straight and trying to raise your heel off the floor. This helps strengthen the largest muscle supporting your knee.  ?Lying on your back, tighten up the muscles of your buttocks both with the legs straight and with the knee bent at a comfortable angle while keeping your heel on the floor.  ? ?SKILLED REHAB INSTRUCTIONS: ?If the patient is transferred to a skilled rehab facility following release from the hospital, a list of the current medications will be sent to the facility for the patient to continue.  When discharged from the skilled rehab facility, please have the facility set up the patient's Home Health Physical Therapy prior to being released. Also, the skilled facility will be responsible for providing the patient with their medications at time of release from the facility to include their pain medication and their blood thinner medication. If the patient is still at the rehab facility at time of the two week follow up appointment, the skilled rehab facility will also need to assist the patient in arranging follow up appointment in our office and any transportation needs. ? ?POST-OPERATIVE OPIOID TAPER INSTRUCTIONS: ?It is important to wean off of your opioid medication as soon as possible. If you do not need pain medication after your surgery it is ok  to stop day one. ?Opioids include: ?Codeine, Hydrocodone(Norco, Vicodin), Oxycodone(Percocet, oxycontin) and hydromorphone amongst others.  ?Long term and even short term use of opiods can cause: ?Increased pain response ?Dependence ?Constipation ?Depression ?Respiratory depression ?And more.  ?Withdrawal symptoms can include ?Flu like symptoms ?Nausea, vomiting ?And more ?Techniques to manage these symptoms ?Hydrate well ?Eat regular healthy meals ?Stay active ?Use relaxation techniques(deep breathing, meditating, yoga) ?Do Not substitute Alcohol to help with tapering ?If you have been on opioids for less than two weeks and do not have pain than it is ok to stop all together.  ?Plan to wean off of opioids ?This plan should start within one week post op of your joint replacement. ?Maintain the same interval or time between taking each dose and first decrease the dose.  ?Cut the total daily intake of opioids by one tablet each day ?Next start to increase the time between doses. ?The last dose that should be eliminated is the evening dose.  ? ? ?MAKE   SURE YOU:  ?Understand these instructions.  ?Will watch your condition.  ?Will get help right away if you are not doing well or get worse. ? ?Pick up stool softner and laxative for home use following surgery while on pain medications. ?Do not remove your dressing. ?The dressing is waterproof--it is OK to take showers. ?Continue to use ice for pain and swelling after surgery. ?Do not use any lotions or creams on the incision until instructed by your surgeon. ?Total Hip Protocol. ? ?

## 2022-05-27 NOTE — Progress Notes (Signed)
Witnessed Methadone 4 ml's wasted by Dorise Hiss, RN in stericycle in PACU.

## 2022-05-27 NOTE — Interval H&P Note (Signed)
History and Physical Interval Note:  05/27/2022 10:18 AM  Kara Fox  has presented today for surgery, with the diagnosis of Right hip osteoarthritis.  The various methods of treatment have been discussed with the patient and family. After consideration of risks, benefits and other options for treatment, the patient has consented to  Procedure(s) with comments: [place] (Right) - 140 can use Collins time for flip room as a surgical intervention.  The patient's history has been reviewed, patient examined, no change in status, stable for surgery.  I have reviewed the patient's chart and labs.  Questions were answered to the patient's satisfaction.     Hilton Cork Tomara Youngberg
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
## 2022-05-27 NOTE — Anesthesia Postprocedure Evaluation (Signed)
Anesthesia Post Note  Patient: Kara Fox  Procedure(s) Performed: TOTAL HIP ARTHROPLASTY ANTERIOR APPROACH (Right: Hip)     Patient location during evaluation: PACU Anesthesia Type: Spinal Level of consciousness: awake and alert, patient cooperative and oriented Pain management: pain level controlled Vital Signs Assessment: post-procedure vital signs reviewed and stable Respiratory status: nonlabored ventilation, spontaneous breathing and respiratory function stable Postop Assessment: spinal receding, adequate PO intake and no apparent nausea or vomiting Anesthetic complications: no   No notable events documented.  Last Vitals:  Vitals:   05/27/22 1545 05/27/22 1600  BP: 133/68 (!) 140/92  Pulse: 64 65  Resp: 16 12  Temp:    SpO2: 97% 98%    Last Pain:  Vitals:   05/27/22 1600  TempSrc:   PainSc: 0-No pain    LLE Motor Response: Purposeful movement (05/27/22 1600) LLE Sensation: Decreased;Numbness (05/27/22 1600) RLE Motor Response: Purposeful movement (05/27/22 1600) RLE Sensation: Decreased;Numbness (05/27/22 1600) L Sensory Level: L3-Anterior knee, lower leg (05/27/22 1600) R Sensory Level: L3-Anterior knee, lower leg (05/27/22 1600)  Tallulah Hosman,E. Arelys Glassco

## 2022-05-28 ENCOUNTER — Encounter (HOSPITAL_COMMUNITY): Payer: Self-pay | Admitting: Orthopedic Surgery

## 2022-05-28 DIAGNOSIS — M1611 Unilateral primary osteoarthritis, right hip: Secondary | ICD-10-CM | POA: Diagnosis not present

## 2022-05-28 LAB — CBC
HCT: 24.2 % — ABNORMAL LOW (ref 36.0–46.0)
Hemoglobin: 7.5 g/dL — ABNORMAL LOW (ref 12.0–15.0)
MCH: 29.6 pg (ref 26.0–34.0)
MCHC: 31 g/dL (ref 30.0–36.0)
MCV: 95.7 fL (ref 80.0–100.0)
Platelets: 239 10*3/uL (ref 150–400)
RBC: 2.53 MIL/uL — ABNORMAL LOW (ref 3.87–5.11)
RDW: 13.4 % (ref 11.5–15.5)
WBC: 11.4 10*3/uL — ABNORMAL HIGH (ref 4.0–10.5)
nRBC: 0 % (ref 0.0–0.2)

## 2022-05-28 LAB — BASIC METABOLIC PANEL
Anion gap: 7 (ref 5–15)
BUN: 14 mg/dL (ref 8–23)
CO2: 27 mmol/L (ref 22–32)
Calcium: 8.2 mg/dL — ABNORMAL LOW (ref 8.9–10.3)
Chloride: 102 mmol/L (ref 98–111)
Creatinine, Ser: 0.78 mg/dL (ref 0.44–1.00)
GFR, Estimated: >60 mL/min (ref >60)
Glucose, Bld: 161 mg/dL — ABNORMAL HIGH (ref 70–99)
Potassium: 4.2 mmol/L (ref 3.5–5.1)
Sodium: 136 mmol/L (ref 135–145)

## 2022-05-28 MED ORDER — ASPIRIN 81 MG PO CHEW: 81.0000 mg | CHEWABLE_TABLET | Freq: Two times a day (BID) | ORAL | 0 refills | Status: AC

## 2022-05-28 MED ORDER — POLYETHYLENE GLYCOL 3350 17 G PO PACK: 17.0000 g | PACK | Freq: Every day | ORAL | 0 refills | Status: AC | PRN

## 2022-05-28 MED ORDER — DOCUSATE SODIUM 100 MG PO CAPS: 100.0000 mg | ORAL_CAPSULE | Freq: Two times a day (BID) | ORAL | 0 refills | Status: AC

## 2022-05-28 MED ORDER — METHOCARBAMOL 500 MG PO TABS: 500.0000 mg | ORAL_TABLET | Freq: Four times a day (QID) | ORAL | 0 refills | Status: AC | PRN

## 2022-05-28 MED ORDER — HYDROCODONE-ACETAMINOPHEN 5-325 MG PO TABS: 1.0000 | ORAL_TABLET | ORAL | 0 refills | Status: AC | PRN

## 2022-05-28 MED ORDER — SENNA 8.6 MG PO TABS: 2.0000 | ORAL_TABLET | Freq: Every day | ORAL | 0 refills | Status: AC

## 2022-05-28 MED ORDER — ONDANSETRON HCL 4 MG PO TABS: 4.0000 mg | ORAL_TABLET | Freq: Three times a day (TID) | ORAL | 0 refills | Status: AC | PRN

## 2022-05-28 NOTE — Progress Notes (Signed)
Physical Therapy Treatment Patient Details Name: Kara Fox MRN: [id number] DOB: [birth year] Today's Date: 05/28/2022   History of Present Illness 67 y.o. female admitted for R THA-AA 05/27/22, pt noted ot have post op periprosthetic greater trochanter fracture, no active ABDuction, WBAT.  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    PT Comments    Pt has met PT goals and is ready to DC home from a mobility standpoint. She ambulated 49' with RW, completed stair training, and demonstrates good understanding of HEP.    Recommendations for follow up therapy are one component of a multi-disciplinary discharge planning process, led by the attending physician.  Recommendations may be updated based on patient status, additional functional criteria and insurance authorization.  Follow Up Recommendations  Home health PT     Assistance Recommended at Discharge Intermittent Supervision/Assistance  Patient can return home with the following A little help with walking and/or transfers;A little help with bathing/dressing/bathroom;Assistance with cooking/housework;Assist for transportation;Help with stairs or ramp for entrance   Equipment Recommendations  None recommended by PT    Recommendations for Other Services       Precautions / Restrictions Precautions Precautions: Fall;Other (comment) Precaution Comments: no active ABDUCTION R hip for 6 weeks 2* periprosthetic fx; reviewed this precaution in detail with pt, recommended getting OOB on L, getting in bed on R Restrictions Weight Bearing Restrictions: No Other Position/Activity Restrictions: WBAT     Mobility  Bed Mobility Overal bed mobility: Needs Assistance Bed Mobility: Supine to Sit     Supine to sit: Supervision, HOB elevated     General bed mobility comments: up in recliner    Transfers Overall transfer level: Needs assistance Equipment used: Rolling walker (2 wheels) Transfers: Sit to/from Stand Sit to  Stand: From elevated surface, Supervision           General transfer comment: cues for hand position    Ambulation/Gait Ambulation/Gait assistance: Supervision Gait Distance (Feet): 130 Feet Assistive device: Rolling walker (2 wheels) Gait Pattern/deviations: Step-to pattern, Decreased step length - right, Decreased step length - left Gait velocity: decr     General Gait Details: cues for sequence, no loss of balance   Stairs Stairs: Yes Stairs assistance: Min assist Stair Management: No rails, Backwards Number of Stairs: 2 General stair comments: VCs sequencing, min A to steady RW   Wheelchair Mobility    Modified Rankin (Stroke Patients Only)       Balance Overall balance assessment: Modified Independent                                          Cognition Arousal/Alertness: Awake/alert Behavior During Therapy: WFL for tasks assessed/performed Overall Cognitive Status: Within Functional Limits for tasks assessed                                          Exercises Total Joint Exercises Ankle Circles/Pumps: AROM, Both, 10 reps Quad Sets: AROM, Both, 5 reps Short Arc Quad: AROM, Right, 5 reps Heel Slides: AAROM, Right, 5 reps Long Arc Quad: AROM, Right, 5 reps, Seated    General Comments        Pertinent Vitals/Pain Pain Assessment Pain Score: 4  Pain Location: R hip Pain Descriptors / Indicators: Discomfort Pain Intervention(s): Repositioned, Ice applied, Monitored during session,  Limited activity within patient's tolerance    Home Living Family/patient expects to be discharged to:: Private residence Living Arrangements: Children Available Help at Discharge: Family;Available 24 hours/day Type of Home: House Home Access: Stairs to enter Entrance Stairs-Rails: None Entrance Stairs-Number of Steps: 2   Home Layout: One level Home Equipment: Cane - single Barista (2 wheels);BSC/3in1      Prior  Function            PT Goals (current goals can now be found in the care plan section) Acute Rehab PT Goals Patient Stated Goal: go on a cruise in June PT Goal Formulation: With patient Time For Goal Achievement: 06/11/22 Potential to Achieve Goals: Good Progress towards PT goals: Progressing toward goals    Frequency    7X/week      PT Plan Current plan remains appropriate    Co-evaluation              AM-PAC PT "6 Clicks" Mobility   Outcome Measure  Help needed turning from your back to your side while in a flat bed without using bedrails?: None Help needed moving from lying on your back to sitting on the side of a flat bed without using bedrails?: A Little Help needed moving to and from a bed to a chair (including a wheelchair)?: None Help needed standing up from a chair using your arms (e.g., wheelchair or bedside chair)?: None Help needed to walk in hospital room?: None Help needed climbing 3-5 steps with a railing? : A Little 6 Click Score: 22    End of Session Equipment Utilized During Treatment: Gait belt Activity Tolerance: Patient tolerated treatment well Patient left: in chair;with call bell/phone within reach;with chair alarm set Nurse Communication: Mobility status PT Visit Diagnosis: Unsteadiness on feet (R26.81);Pain;Difficulty in walking, not elsewhere classified (R26.2) Pain - Right/Left: Right Pain - part of body: Hip     Time: 1311-1340 PT Time Calculation (min) (ACUTE ONLY): 29 min  Charges:  $Gait Training: 8-22 mins $Therapeutic Exercise: 8-22 mins                     Blondell Reveal Kistler PT 05/28/2022  Acute Rehabilitation Services  Office [phone]

## 2022-05-28 NOTE — Evaluation (Signed)
Physical Therapy Evaluation Patient Details Name: Kara Fox MRN: [id number] DOB: June 10, 1955 Today's Date: 05/28/2022  History of Present Illness  67 y.o. female admitted for R THA-AA 05/27/22, pt noted ot have post op periprosthetic greater trochanter fracture, no active ABDuction, WBAT.  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  Clinical Impression  Pt admitted with above diagnosis. Pt is doing well with mobility, she ambulated 61' with RW, no loss of balance. Instructed pt in no active R hip abduction precaution in detail, she demonstrates good understanding. Good progress expected.  Pt currently with functional limitations due to the deficits listed below (see PT Problem List). Pt will benefit from skilled PT to increase their independence and safety with mobility to allow discharge to the venue listed below.          Recommendations for follow up therapy are one component of a multi-disciplinary discharge planning process, led by the attending physician.  Recommendations may be updated based on patient status, additional functional criteria and insurance authorization.  Follow Up Recommendations Home health PT      Assistance Recommended at Discharge Intermittent Supervision/Assistance  Patient can return home with the following  A little help with walking and/or transfers;A little help with bathing/dressing/bathroom;Assistance with cooking/housework;Assist for transportation;Help with stairs or ramp for entrance    Equipment Recommendations None recommended by PT  Recommendations for Other Services       Functional Status Assessment Patient has had a recent decline in their functional status and demonstrates the ability to make significant improvements in function in a reasonable and predictable amount of time.     Precautions / Restrictions Precautions Precautions: Fall;Other (comment) Precaution Comments: no active ABDUCTION R hip for 6 weeks 2* periprosthetic fx;  reviewed this precaution in detail with pt, recommended getting OOB on L, getting in bed on R Restrictions Weight Bearing Restrictions: No Other Position/Activity Restrictions: WBAT      Mobility  Bed Mobility Overal bed mobility: Needs Assistance Bed Mobility: Supine to Sit     Supine to sit: Supervision, HOB elevated     General bed mobility comments: instructed to get out of bed to L side to avoid R hip ABDuction    Transfers Overall transfer level: Needs assistance Equipment used: Rolling walker (2 wheels) Transfers: Sit to/from Stand Sit to Stand: From elevated surface, Min guard           General transfer comment: cues for hand and right leg position    Ambulation/Gait Ambulation/Gait assistance: Min guard Gait Distance (Feet): 40 Feet Assistive device: Rolling walker (2 wheels) Gait Pattern/deviations: Step-to pattern, Decreased step length - right, Decreased step length - left Gait velocity: decr     General Gait Details: cues for sequence, no loss of balance  Stairs            Wheelchair Mobility    Modified Rankin (Stroke Patients Only)       Balance Overall balance assessment: Modified Independent                                           Pertinent Vitals/Pain Pain Assessment Pain Score: 4  Pain Location: R hip Pain Descriptors / Indicators: Discomfort Pain Intervention(s): Limited activity within patient's tolerance, Monitored during session, Premedicated before session, Ice applied    Home Living Family/patient expects to be discharged to:: Private residence Living Arrangements: Children Available Help  at Discharge: Family;Available 24 hours/day Type of Home: House Home Access: Stairs to enter Entrance Stairs-Rails: None Entrance Stairs-Number of Steps: 2   Home Layout: One level Home Equipment: Cane - single Barista (2 wheels);BSC/3in1      Prior Function Prior Level of Function :  Independent/Modified Independent                     Hand Dominance   Dominant Hand: Right    Extremity/Trunk Assessment   Upper Extremity Assessment Upper Extremity Assessment: Overall WFL for tasks assessed    Lower Extremity Assessment Lower Extremity Assessment: LLE deficits/detail;RLE deficits/detail RLE Deficits / Details: did not assess R hip ABDuction, knee ext at least 3/5, ankle WNL, hip flexion AAROM ~35* limited by pain RLE Sensation: WNL RLE Coordination: WNL       Communication   Communication: No difficulties  Cognition Arousal/Alertness: Awake/alert Behavior During Therapy: WFL for tasks assessed/performed Overall Cognitive Status: Within Functional Limits for tasks assessed                                          General Comments      Exercises Total Joint Exercises Ankle Circles/Pumps: AROM, Both, 10 reps Quad Sets: AROM, Both, 5 reps Heel Slides: AAROM, Right, 5 reps   Assessment/Plan    PT Assessment Patient needs continued PT services  PT Problem List Decreased strength;Decreased activity tolerance;Decreased mobility;Decreased safety awareness;Pain;Decreased knowledge of use of DME       PT Treatment Interventions DME instruction;Stair training;Therapeutic activities;Gait training;Functional mobility training;Therapeutic exercise;Patient/family education    PT Goals (Current goals can be found in the Care Plan section)  Acute Rehab PT Goals Patient Stated Goal: go on a cruise in June PT Goal Formulation: With patient Time For Goal Achievement: 06/11/22 Potential to Achieve Goals: Good    Frequency 7X/week     Co-evaluation               AM-PAC PT "6 Clicks" Mobility  Outcome Measure Help needed turning from your back to your side while in a flat bed without using bedrails?: None Help needed moving from lying on your back to sitting on the side of a flat bed without using bedrails?: A Little Help  needed moving to and from a bed to a chair (including a wheelchair)?: A Little Help needed standing up from a chair using your arms (e.g., wheelchair or bedside chair)?: A Little Help needed to walk in hospital room?: A Little Help needed climbing 3-5 steps with a railing? : A Lot 6 Click Score: 18    End of Session Equipment Utilized During Treatment: Gait belt Activity Tolerance: Patient tolerated treatment well Patient left: in chair;with call bell/phone within reach;with chair alarm set Nurse Communication: Mobility status PT Visit Diagnosis: Unsteadiness on feet (R26.81);Pain;Difficulty in walking, not elsewhere classified (R26.2) Pain - Right/Left: Right Pain - part of body: Hip    Time: 0940-1010 PT Time Calculation (min) (ACUTE ONLY): 30 min   Charges:   PT Evaluation $PT Eval Moderate Complexity: 1 Mod PT Treatments $Gait Training: 8-22 mins        Blondell Reveal Kistler PT 05/28/2022  Acute Rehabilitation Services  Office [phone]

## 2022-05-28 NOTE — TOC Transition Note (Signed)
Transition of Care [facility]) - CM/SW Discharge Note   Patient Details  Name: Kara Fox MRN: [id number] Date of Birth: 06-06-1955  Transition of Care [facility]) CM/SW Contact:  Lennart Pall, LCSW Phone Number: 05/28/2022, 9:36 AM   Clinical Narrative:     Met with pt who confirms she has a RW at home, however, needing bedside commode - no DME agency preference - order placed with [employer] for delivery to room.  Plan for HEP.  No further TOC needs.  Final next level of care: Home/Self Care Barriers to Discharge: No Barriers Identified   Patient Goals and CMS Choice      Discharge Placement                         Discharge Plan and Services Additional resources added to the After Visit Summary for                  DME Arranged: 3-N-1 DME Agency: [employer] Date DME Agency Contacted: 05/28/22 Time DME Agency Contacted: [phone]              Social Determinants of Health (SDOH) Interventions SDOH Screenings   Food Insecurity: No Food Insecurity (04/09/2022)  Housing: Low Risk  (05/27/2022)  Transportation Needs: No Transportation Needs (04/09/2022)  Utilities: Not At Risk (04/09/2022)  Tobacco Use: Medium Risk (05/27/2022)     Readmission Risk Interventions     No data to display

## 2022-05-28 NOTE — Plan of Care (Signed)
  Problem: Education: Goal: Knowledge of the prescribed therapeutic regimen will improve 05/28/2022 0738 by Olen Cordial, RN Outcome: Progressing 05/27/2022 1739 by Olen Cordial, RN Outcome: Progressing   Problem: Education: Goal: Knowledge of General Education information will improve Description: Including pain rating scale, medication(s)/side effects and non-pharmacologic comfort measures 05/28/2022 0738 by Olen Cordial, RN Outcome: Progressing 05/27/2022 1739 by Olen Cordial, RN Outcome: Progressing   Problem: Activity: Goal: Risk for activity intolerance will decrease 05/28/2022 0738 by Olen Cordial, RN Outcome: Progressing 05/27/2022 1739 by Olen Cordial, RN Outcome: Progressing

## 2022-05-28 NOTE — Progress Notes (Addendum)
    Subjective:  Patient reports pain as mild to moderate.  Denies N/V/CP/SOB/Abd pain. She states she is having some thigh pain this morning. Denies any tingling or numbness in LE bilaterally.   Objective:   VITALS:   Vitals:   05/27/22 1730 05/27/22 2003 05/28/22 0203 05/28/22 0629  BP: (!) 145/85 119/71 129/81 (!) 145/65  Pulse: 60 65 67 62  Resp: 16 18 18 16   Temp: 97.8 F (36.6 C) 98.3 F (36.8 C) 98 F (36.7 C) 98 F (36.7 C)  TempSrc: Oral Oral Oral Oral  SpO2: 99% 96% 100% 100%  Weight:      Height:        Patient is sitting up in bed. NAD.  Neurologically intact ABD soft Neurovascular intact Sensation intact distally Intact pulses distally Dorsiflexion/Plantar flexion intact Incision: dressing C/D/I No cellulitis present Compartment soft   Lab Results  Component Value Date   WBC 11.4 (H) 05/28/2022   HGB 7.5 (L) 05/28/2022   HCT 24.2 (L) 05/28/2022   MCV 95.7 05/28/2022   PLT 239 05/28/2022   BMET    Component Value Date/Time   NA 136 05/28/2022 0329   K 4.2 05/28/2022 0329   CL 102 05/28/2022 0329   CO2 27 05/28/2022 0329   GLUCOSE 161 (H) 05/28/2022 0329   BUN 14 05/28/2022 0329   CREATININE 0.78 05/28/2022 0329   CALCIUM 8.2 (L) 05/28/2022 0329   GFRNONAA >60 05/28/2022 0329     Assessment/Plan: 1 Day Post-Op   Principal Problem:   Osteoarthritis of right hip  ABLA. Hemoglobin 7.5. Asymptomatic. Continue to monitor.   WBAT with walker. No active ABDUCTION.  DVT ppx: Aspirin, SCDs, TEDS PO pain control PT/OT: PT has not seen yet.  Dispo: D/c home with HEP, avoid active abduction for 6 weeks. Patient will follow-up in office 2 weeks postop.   Charlott Rakes, PA-C 05/28/2022, 7:24 AM   [facility]  [place] [address], [address], [place], [place] [address] Phone: [phone] [email or web address] Facebook  [employer]

## 2022-05-30 ENCOUNTER — Encounter (HOSPITAL_COMMUNITY): Payer: Self-pay

## 2022-05-30 ENCOUNTER — Other Ambulatory Visit: Payer: Self-pay

## 2022-05-30 ENCOUNTER — Inpatient Hospital Stay (HOSPITAL_COMMUNITY)
Admission: EM | Admit: 2022-05-30 | Discharge: 2022-06-04 | DRG: 603 | Disposition: A | Discharge status: Home / Self Care | Payer: Medicaid Other | Attending: Family Medicine | Admitting: Family Medicine

## 2022-05-30 DIAGNOSIS — I10 Essential (primary) hypertension: Secondary | ICD-10-CM | POA: Diagnosis present

## 2022-05-30 DIAGNOSIS — Z87891 Personal history of nicotine dependence: Secondary | ICD-10-CM

## 2022-05-30 DIAGNOSIS — Z79899 Other long term (current) drug therapy: Secondary | ICD-10-CM | POA: Diagnosis not present

## 2022-05-30 DIAGNOSIS — D62 Acute posthemorrhagic anemia: Secondary | ICD-10-CM | POA: Diagnosis present

## 2022-05-30 DIAGNOSIS — M7989 Other specified soft tissue disorders: Secondary | ICD-10-CM | POA: Diagnosis present

## 2022-05-30 DIAGNOSIS — F1120 Opioid dependence, uncomplicated: Secondary | ICD-10-CM | POA: Diagnosis present

## 2022-05-30 DIAGNOSIS — E785 Hyperlipidemia, unspecified: Secondary | ICD-10-CM | POA: Diagnosis present

## 2022-05-30 DIAGNOSIS — Z7982 Long term (current) use of aspirin: Secondary | ICD-10-CM | POA: Diagnosis not present

## 2022-05-30 DIAGNOSIS — M1611 Unilateral primary osteoarthritis, right hip: Secondary | ICD-10-CM | POA: Diagnosis present

## 2022-05-30 DIAGNOSIS — Z96641 Presence of right artificial hip joint: Secondary | ICD-10-CM | POA: Diagnosis not present

## 2022-05-30 DIAGNOSIS — L0390 Cellulitis, unspecified: Secondary | ICD-10-CM

## 2022-05-30 DIAGNOSIS — Z96643 Presence of artificial hip joint, bilateral: Secondary | ICD-10-CM | POA: Diagnosis present

## 2022-05-30 DIAGNOSIS — B182 Chronic viral hepatitis C: Secondary | ICD-10-CM | POA: Diagnosis present

## 2022-05-30 DIAGNOSIS — L03115 Cellulitis of right lower limb: Secondary | ICD-10-CM | POA: Diagnosis present

## 2022-05-30 DIAGNOSIS — D649 Anemia, unspecified: Secondary | ICD-10-CM

## 2022-05-30 DIAGNOSIS — F1111 Opioid abuse, in remission: Secondary | ICD-10-CM | POA: Diagnosis not present

## 2022-05-30 LAB — CBC
HCT: 21.7 % — ABNORMAL LOW (ref 36.0–46.0)
Hemoglobin: 6.6 g/dL — CL (ref 12.0–15.0)
MCH: 29.9 pg (ref 26.0–34.0)
MCHC: 30.4 g/dL (ref 30.0–36.0)
MCV: 98.2 fL (ref 80.0–100.0)
Platelets: 180 10*3/uL (ref 150–400)
RBC: 2.21 MIL/uL — ABNORMAL LOW (ref 3.87–5.11)
RDW: 14.1 % (ref 11.5–15.5)
WBC: 10.4 10*3/uL (ref 4.0–10.5)
nRBC: 0 % (ref 0.0–0.2)

## 2022-05-30 LAB — BASIC METABOLIC PANEL
Anion gap: 11 (ref 5–15)
BUN: 10 mg/dL (ref 8–23)
CO2: 23 mmol/L (ref 22–32)
Calcium: 8.7 mg/dL — ABNORMAL LOW (ref 8.9–10.3)
Chloride: 103 mmol/L (ref 98–111)
Creatinine, Ser: 0.49 mg/dL (ref 0.44–1.00)
GFR, Estimated: >60 mL/min (ref >60)
Glucose, Bld: 94 mg/dL (ref 70–99)
Potassium: 3.9 mmol/L (ref 3.5–5.1)
Sodium: 137 mmol/L (ref 135–145)

## 2022-05-30 LAB — PREPARE RBC (CROSSMATCH)

## 2022-05-30 LAB — LACTIC ACID, PLASMA: Lactic Acid, Venous: 1.1 mmol/L (ref 0.5–1.9)

## 2022-05-30 LAB — SEDIMENTATION RATE: Sed Rate: >140 mm/hr — ABNORMAL HIGH (ref 0–22)

## 2022-05-30 MED ORDER — MORPHINE SULFATE (PF) 4 MG/ML IV SOLN
4.0000 mg | Freq: Once | INTRAVENOUS | Status: AC
  Administered 2022-05-30: 4 mg via INTRAVENOUS
  Filled 2022-05-30: qty 1

## 2022-05-30 MED ORDER — HYDROMORPHONE HCL 1 MG/ML IJ SOLN
1.0000 mg | INTRAMUSCULAR | Status: DC | PRN
  Administered 2022-05-31: 1 mg via INTRAVENOUS
  Filled 2022-05-30: qty 1

## 2022-05-30 MED ORDER — SODIUM CHLORIDE 0.9 % IV SOLN
2.0000 g | Freq: Once | INTRAVENOUS | Status: AC
  Administered 2022-05-31: 2 g via INTRAVENOUS
  Filled 2022-05-30: qty 12.5

## 2022-05-30 MED ORDER — VANCOMYCIN HCL IN DEXTROSE 1-5 GM/200ML-% IV SOLN: 1000.0000 mg | INTRAVENOUS | Status: DC

## 2022-05-30 MED ORDER — SODIUM CHLORIDE 0.9% IV SOLUTION: Freq: Once | INTRAVENOUS | Status: AC

## 2022-05-30 MED ORDER — ACETAMINOPHEN 325 MG PO TABS
650.0000 mg | ORAL_TABLET | Freq: Once | ORAL | Status: AC
  Administered 2022-05-30: 650 mg via ORAL
  Filled 2022-05-30: qty 2

## 2022-05-30 MED ORDER — ACETAMINOPHEN 325 MG PO TABS
650.0000 mg | ORAL_TABLET | Freq: Four times a day (QID) | ORAL | Status: DC | PRN
  Administered 2022-05-31 – 2022-06-04 (×4): 650 mg via ORAL
  Filled 2022-05-30 (×4): qty 2

## 2022-05-30 MED ORDER — VANCOMYCIN HCL IN DEXTROSE 1-5 GM/200ML-% IV SOLN
1000.0000 mg | Freq: Once | INTRAVENOUS | Status: AC
  Administered 2022-05-30: 1000 mg via INTRAVENOUS
  Filled 2022-05-30: qty 200

## 2022-05-30 MED ORDER — ASPIRIN 81 MG PO CHEW
81.0000 mg | CHEWABLE_TABLET | Freq: Every day | ORAL | Status: DC
  Administered 2022-05-31 – 2022-06-04 (×5): 81 mg via ORAL
  Filled 2022-05-30 (×5): qty 1

## 2022-05-30 MED ORDER — MORPHINE SULFATE (PF) 4 MG/ML IV SOLN
4.0000 mg | INTRAVENOUS | Status: DC | PRN
  Administered 2022-05-31 (×2): 4 mg via INTRAVENOUS
  Filled 2022-05-30 (×2): qty 1

## 2022-05-30 NOTE — Assessment & Plan Note (Signed)
-   Patient reports being on methadone 110 mg daily.  Will need to verify in the morning with clinic to resume. -continue with  PRN IV morphine and Dilaudid for pain overnight

## 2022-05-30 NOTE — H&P (Signed)
History and Physical    Patient: Kara Fox DOB: Sep 02, 1955 DOA: 05/30/2022 DOS: the patient was seen and examined on 05/30/2022 PCP: Benito Mccreedy, MD  Patient coming from: Home  Chief Complaint:  Chief Complaint  Patient presents with   Leg Swelling   HPI: Kara Fox is a 67 y.o. female with medical history significant of opioid use disorder on methadone, HTN, HLD, NASH, chronic hepatitis C, and recent right hip arthoplasty who presents with increase right hip pain.   Pt had right hip arthroplasty for longstanding right hip arthritis on 3/20 with orthopedic Dr. Lyla Glassing.  Upon returning home, she has been attempting to do prescribed exercises.  However the following day began and noticed increased swelling and pain around her left hip and anterior thigh.  She denies any lower calf pain or swelling.  Has been compliant with her daily aspirin for DVT prophylaxis.  In the ED, initially afebrile but later developed temperature 100.8 F, normotensive on room air.  No leukocytosis, hemoglobin down trended to 6.6 from a prior of 7.5.  It also appears that January her hemoglobin was otherwise normal but has downward trended progressively.  BMP without significant electrolyte abnormalities.  Sed rate of greater than 140.  She was started on IV vancomycin and orthopedic surgery was consulted and Dr. Lyla Glassing will follow-up tomorrow in the morning. Review of Systems: As mentioned in the history of present illness. All other systems reviewed and are negative. Past Medical History:  Diagnosis Date   Arthritis    Asthma    childhood   Hepatitis    Hypertension    Past Surgical History:  Procedure Laterality Date   CESAREAN SECTION     HERNIA REPAIR     TOTAL HIP ARTHROPLASTY Left 04/09/2022   Procedure: TOTAL HIP ARTHROPLASTY ANTERIOR APPROACH;  Surgeon: Rod Can, MD;  Location: [facility];  Service: Orthopedics;  Laterality: Left;  125   TOTAL HIP  ARTHROPLASTY Right 05/27/2022   Procedure: TOTAL HIP ARTHROPLASTY ANTERIOR APPROACH;  Surgeon: Rod Can, MD;  Location: [facility];  Service: Orthopedics;  Laterality: Right;  140 can use [place] time for flip room   Social History:  reports that she quit smoking about 4 months ago. Her smoking use included cigarettes. She has a 8.75 pack-year smoking history. She has never used smokeless tobacco. She reports that she does not currently use alcohol. She reports that she does not currently use drugs.  No Known Allergies  Family History  Family history unknown: Yes    Prior to Admission medications   Medication Sig Start Date End Date Taking? Authorizing Provider  aspirin (ASPIRIN CHILDRENS) 81 MG chewable tablet Chew 1 tablet (81 mg total) by mouth 2 (two) times daily with a meal. 05/28/22 07/12/22  Hill, Marciano Sequin, PA-C  docusate sodium (COLACE) 100 MG capsule Take 1 capsule (100 mg total) by mouth 2 (two) times daily. 05/28/22 06/27/22  Charlott Rakes, PA-C  HYDROcodone-acetaminophen (NORCO/VICODIN) 5-325 MG tablet Take 1 tablet by mouth every 4 (four) hours as needed for moderate pain or severe pain. 05/28/22 05/28/23  Charlott Rakes, PA-C  ibuprofen (ADVIL) 200 MG tablet Take 200-400 mg by mouth every 6 (six) hours as needed for moderate pain.    [provider]  methadone (DOLOPHINE) 10 MG/ML solution Take 110 mg by mouth in the morning.    [provider]  methocarbamol (ROBAXIN) 500 MG tablet Take 1 tablet (500 mg total) by mouth every 6 (six) hours  as needed for muscle spasms. 05/28/22   Hill, Marciano Sequin, PA-C  ondansetron (ZOFRAN) 4 MG tablet Take 1 tablet (4 mg total) by mouth every 8 (eight) hours as needed for nausea or vomiting. 05/28/22 05/28/23  Charlott Rakes, PA-C  polyethylene glycol (MIRALAX) 17 g packet Take 17 g by mouth daily as needed for mild constipation or moderate constipation. 05/28/22 06/27/22  Charlott Rakes, PA-C  senna (SENOKOT) 8.6 MG TABS tablet Take 2 tablets  (17.2 mg total) by mouth at bedtime for 15 days. 05/28/22 06/12/22  Charlott Rakes, PA-C    Physical Exam: Vitals:   05/30/22 2114 05/30/22 2118 05/30/22 2129 05/30/22 2224  BP: (!) 147/78  (!) 154/78   Pulse: 95  98   Resp: 18  18   Temp: 99.7 F (37.6 C)  (!) 100.8 F (38.2 C) 100 F (37.8 C)  TempSrc: Oral  Oral Oral  SpO2:  100% 100%   Weight:      Height:       Constitutional: NAD, calm, comfortable, well-appearing elderly female appearing and stated age laying in bed Eyes: lids and conjunctivae normal ENMT: Mucous membranes are moist.  Neck: normal, supple Respiratory: clear to auscultation bilaterally, no wheezing, no crackles. Normal respiratory effort. No accessory muscle use.  Cardiovascular: Regular rate and rhythm, no murmurs / rubs / gallops.  Abdomen: no tenderness, Bowel sounds positive.  Musculoskeletal: no clubbing / cyanosis. No joint deformity upper and lower extremities. Good ROM, no contractures. Normal muscle tone.  Skin: increase warmth, erythema and pain with palpation of anterior right thigh. Clean horizontal incision wound without drainage on distal to right inguinal fold. No right calf swelling or pain.  Neurologic: CN 2-12 grossly intact.  Psychiatric: Normal judgment and insight. Alert and oriented x 3. Normal mood. Data Reviewed:  See HPI  Assessment and Plan: * Wound cellulitis -s/p POD 3 of right hip arthroplasty with orthopedic Dr. Lyla Glassing -developed increased edema, erythema and pain to right groin and anterior thigh.  Wound incision appears clean on exam without any drainage.  Most likely developing cellulitis around the region. -Given IV vancomycin in the ED.  Will continue and also add on IV cefepime. -Orthopedic surgery was consulted overnight and will see in the morning.  Will defer to orthopedics for recommendations of further imaging. -continue daily aspirin for DVT prophylaxis  History of opioid abuse ([place]) - Patient reports being on  methadone 110 mg daily.  Will need to verify in the morning with clinic to resume. -continue with  PRN IV morphine and Dilaudid for pain overnight  Normocytic anemia - Patient had normal hemoglobin back early January but has progressively downward trending hemoglobin down to 6.6 on presentation today.  She denies any bleeding or dark stools.  She did recently have left total hip arthroplasty on 04/09/2022 in addition to her right hip arthroplasty 3 days ago so this could be postoperative blood loss that has yet to recover. - will transfer 1u PRBC and follow post H/H -Check iron, vitamin 123456 and folic as well      Advance Care Planning:   Code Status: Full Code   Consults: orthopedic surgery Dr. Lyla Glassing to evaluate in the morning  Family Communication: none at bedside  Severity of Illness: The appropriate patient status for this patient is INPATIENT. Inpatient status is judged to be reasonable and necessary in order to provide the required intensity of service to ensure the patient's safety. The patient's presenting symptoms, physical exam findings,  and initial radiographic and laboratory data in the context of their chronic comorbidities is felt to place them at high risk for further clinical deterioration. Furthermore, it is not anticipated that the patient will be medically stable for discharge from the hospital within 2 midnights of admission.   * I certify that at the point of admission it is my clinical judgment that the patient will require inpatient hospital care spanning beyond 2 midnights from the point of admission due to high intensity of service, high risk for further deterioration and high frequency of surveillance required.*  Author: Orene Desanctis, DO 05/30/2022 11:23 PM  For on call review [email or web address].

## 2022-05-30 NOTE — ED Provider Notes (Signed)
[place] EMERGENCY DEPARTMENT AT [facility] Provider Note   CSN: [id number] Arrival date & time: 05/30/22  1636     History  Chief Complaint  Patient presents with   Leg Swelling    Kara Fox is a 67 y.o. female.  Patient underwent a right total hip arthroplasty on 3/20 by Dr. Lyla Glassing.  She was discharged from the hospital yesterday.  She reports that at home her leg started to swell more, she has had worsening pain that is not alleviated with her pain medications.  No fevers but she has been having rigors.  The pain in her hip goes down to her knees.  She has not been able to move much secondary to the discomfort.  She has been taking her daily aspirin for VTE ppx. No N/V. No SOB at rest but feels SOB when the pain comes on.  Patient did have a periprosthetic fracture of the greater trochanter following replacement but no further surgery indicated. She has been doing her home exercises and has abstained from exercises as instructed.         Home Medications Prior to Admission medications   Medication Sig Start Date End Date Taking? Authorizing Provider  aspirin (ASPIRIN CHILDRENS) 81 MG chewable tablet Chew 1 tablet (81 mg total) by mouth 2 (two) times daily with a meal. 05/28/22 07/12/22  Hill, Marciano Sequin, PA-C  docusate sodium (COLACE) 100 MG capsule Take 1 capsule (100 mg total) by mouth 2 (two) times daily. 05/28/22 06/27/22  Charlott Rakes, PA-C  HYDROcodone-acetaminophen (NORCO/VICODIN) 5-325 MG tablet Take 1 tablet by mouth every 4 (four) hours as needed for moderate pain or severe pain. 05/28/22 05/28/23  Charlott Rakes, PA-C  ibuprofen (ADVIL) 200 MG tablet Take 200-400 mg by mouth every 6 (six) hours as needed for moderate pain.    [provider]  methadone (DOLOPHINE) 10 MG/ML solution Take 110 mg by mouth in the morning.    [provider]  methocarbamol (ROBAXIN) 500 MG tablet Take 1 tablet (500 mg total) by mouth every 6 (six) hours as  needed for muscle spasms. 05/28/22   Hill, Marciano Sequin, PA-C  ondansetron (ZOFRAN) 4 MG tablet Take 1 tablet (4 mg total) by mouth every 8 (eight) hours as needed for nausea or vomiting. 05/28/22 05/28/23  Charlott Rakes, PA-C  polyethylene glycol (MIRALAX) 17 g packet Take 17 g by mouth daily as needed for mild constipation or moderate constipation. 05/28/22 06/27/22  Charlott Rakes, PA-C  senna (SENOKOT) 8.6 MG TABS tablet Take 2 tablets (17.2 mg total) by mouth at bedtime for 15 days. 05/28/22 06/12/22  Charlott Rakes, PA-C      Allergies    Patient has no known allergies.    Review of Systems   Review of Systems  Constitutional:  Positive for chills. Negative for fever.  Respiratory:  Negative for shortness of breath.   Gastrointestinal:  Negative for abdominal pain, nausea and vomiting.  Musculoskeletal:  Positive for joint swelling.       +right hip and thigh pain  Skin:  Positive for color change.  Neurological:  Positive for weakness.    Physical Exam Updated Vital Signs BP (!) 154/78 (BP Location: Left Arm)   Pulse 98   Temp 100 F (37.8 C) (Oral)   Resp 18   Ht 5\' 1"  (1.549 m)   Wt 62.1 kg   SpO2 100%   BMI 25.89 kg/m  Physical Exam Constitutional:  General: She is in acute distress.     Appearance: She is ill-appearing.  HENT:     Head: Normocephalic.     Nose: Nose normal.     Mouth/Throat:     Mouth: Mucous membranes are moist.     Pharynx: Oropharynx is clear.  Cardiovascular:     Rate and Rhythm: Normal rate and regular rhythm.     Pulses: Normal pulses.     Heart sounds: Normal heart sounds.  Pulmonary:     Effort: Pulmonary effort is normal. No respiratory distress.     Breath sounds: Normal breath sounds. No stridor. No wheezing or rhonchi.  Abdominal:     General: Bowel sounds are normal.     Palpations: Abdomen is soft.     Tenderness: There is no abdominal tenderness.  Musculoskeletal:     Cervical back: Neck supple.     Comments: Unable to lift right  leg up. Right thigh is edematous, erythematous, and warm to touch with significant ttp along right hip and anterior thigh  Skin:    Comments: Right LE surgical scar appears to be healing well without drainage or streaking erythema surrounding  Neurological:     Mental Status: She is alert. Mental status is at baseline.     Comments: Decreased sensation to right lateral and anterior thigh compared to left.      ED Results / Procedures / Treatments   Labs (all labs ordered are listed, but only abnormal results are displayed) Labs Reviewed  CBC - Abnormal; Notable for the following components:      Result Value   RBC 2.21 (*)    Hemoglobin 6.6 (*)    HCT 21.7 (*)    All other components within normal limits  BASIC METABOLIC PANEL - Abnormal; Notable for the following components:   Calcium 8.7 (*)    All other components within normal limits  SEDIMENTATION RATE - Abnormal; Notable for the following components:   Sed Rate >140 (*)    All other components within normal limits  CULTURE, BLOOD (ROUTINE X 2)  CULTURE, BLOOD (ROUTINE X 2)  LACTIC ACID, PLASMA  C-REACTIVE PROTEIN  PREPARE RBC (CROSSMATCH)    EKG None  Radiology No results found.  Procedures Procedures    Medications Ordered in ED Medications  vancomycin (VANCOCIN) IVPB 1000 mg/200 mL premix (1,000 mg Intravenous New Bag/Given 05/30/22 2221)  morphine (PF) 4 MG/ML injection 4 mg (4 mg Intravenous Given 05/30/22 1728)  0.9 %  sodium chloride infusion (Manually program via Guardrails IV Fluids) ( Intravenous New Bag/Given 05/30/22 2019)  morphine (PF) 4 MG/ML injection 4 mg (4 mg Intravenous Given 05/30/22 2020)  acetaminophen (TYLENOL) tablet 650 mg (650 mg Oral Given 05/30/22 2142)    ED Course/ Medical Decision Making/ A&P                             Medical Decision Making 67 y/o F who is POD#3 from right total hip arthroplasty who presents with increased pain, edema and erythema to right hip concerning for  cellulitis or other infectious cause. Does not appear consistent with DVT given rigors, erythema and no SOB. Additionally initial vitals without tachycardia or hypoxia. Received 50 mcg fentanyl by EMS. Will give 4mg  Morphine. Initiate sepsis workup- give IVF, obtain blood cultures, LA, ESR/CRP, CBC, BMP. Will discuss case with physician on-call for Dr. Lyla Glassing for further recommendations once labs return.   6:00 PM Hgb  returned low at 6.6. Was 7.5 on 3/21. Updated patient with results. Discussed risks, benefits, alternatives of blood transfusion. She is amenable to proceed. Pain is improved with pain medication.   8:02 PM pt now has IV (was very difficult stick). Awaiting lab results and blood transfusion. On reassessment pt reports pain is 8/10. Had some relief with morphine so will re-dose.   8:52 PM stable/normal BMP.   9:42 PM LA reassuring. Sed rate >140. RN informed me that temperature increased to 100.8 approximately 15 minutes after transfusion. Temp prior to transfusion was 99.7. In to evaluate pt and she is in NAD, no difficulty breathing. Normal lung sounds. Pain is well controlled. Will give dose Tylenol, start IV Vanc. Ortho paged for recommendations. Will ultimately need admission for anemia, IV abx, monitoring.   10:02 PM re-assessed pt. Compartments soft, low suspicion for compartment syndrome at this time. Updated daughter via telephone. Daughter did inform me that patient does take methadone daily but has not had in 2 days. Pt states she takes 110 mg of methadone, last dose Thursday 3/21.   10:09 PM D/w Dr. Theda Sers with [facility], will have someone see in consult. States that Dr. Lyla Glassing will see in AM between 7-8. Recommends admission to medicine. Will page for admission.   10:40 PM discussed with hospitalist who will see for admission.   Amount and/or Complexity of Data Reviewed Labs: ordered.  Risk OTC drugs. Prescription drug management.         Final Clinical  Impression(s) / ED Diagnoses Final diagnoses:  Cellulitis of right lower extremity  Postoperative anemia    Rx / DC Orders ED Discharge Orders     None         Sharion Settler, DO 05/30/22 2243    Lacretia Leigh, MD 05/31/22 1606

## 2022-05-30 NOTE — Assessment & Plan Note (Signed)
-   Patient had normal hemoglobin back early January but has progressively downward trending hemoglobin down to 6.6 on presentation today.  She denies any bleeding or dark stools.  She did recently have left total hip arthroplasty on 04/09/2022 in addition to her right hip arthroplasty 3 days ago so this could be postoperative blood loss that has yet to recover. - will transfer 1u PRBC and follow post H/H -Check iron, vitamin 123456 and folic as well

## 2022-05-30 NOTE — Assessment & Plan Note (Addendum)
-  s/p POD 3 of right hip arthroplasty with orthopedic Dr. Lyla Glassing -developed increased edema, erythema and pain to right groin and anterior thigh.  Wound incision appears clean on exam without any drainage.  Most likely developing cellulitis around the region. -Given IV vancomycin in the ED.  Will continue and also add on IV cefepime. -Orthopedic surgery was consulted overnight and will see in the morning.  Will defer to orthopedics for recommendations of further imaging. -continue daily aspirin for DVT prophylaxis

## 2022-05-30 NOTE — ED Provider Notes (Signed)
I saw and evaluated the patient, reviewed the resident's note and I agree with the findings and plan.   Patient presents with redness and swelling to right hip.  Had surgery recently.  On exam, she has evidence of cellulitis with erythema and warmness to the touch.  Will obtain laboratory studies here and consult orthopedics   Lacretia Leigh, MD 05/30/22 1724

## 2022-05-30 NOTE — Progress Notes (Addendum)
Pharmacy Antibiotic Note  Kara Fox is a 67 y.o. female admitted on 05/30/2022 with redness and swelling to right hip. Had surgery recently. On exam, she has evidence of cellulitis with erythema and warmness to the touch .  Pharmacy has been consulted to dose vancomycin and cefepime for cellulitis  Plan: Vancomycin 1gm x 1 then 1gm q24h (AUC 457.4, used Scr 0.8) Cefepime 2gm IV q8h Follow renal function and clinical course  Height: 5\' 1"  (154.9 cm) Weight: 62.1 kg (137 lb) IBW/kg (Calculated) : 47.8  Temp (24hrs), Avg:99.6 F (37.6 C), Min:98.3 F (36.8 C), Max:100.8 F (38.2 C)  Recent Labs  Lab 05/28/22 0329 05/30/22 1708 05/30/22 1930  WBC 11.4* 10.4  --   CREATININE 0.78 0.49  --   LATICACIDVEN  --   --  1.1    Estimated Creatinine Clearance: 60 mL/min (by C-G formula based on SCr of 0.49 mg/dL).    No Known Allergies  Antimicrobials this admission: 3/23 vanc >> 3/24 cefepime >>  Dose adjustments this admission:   Microbiology results: 3/23 BCx:    Thank you for allowing pharmacy to be a part of this patient's care.  Dolly Rias RPh 05/30/2022, 10:16 PM

## 2022-05-31 DIAGNOSIS — L0390 Cellulitis, unspecified: Secondary | ICD-10-CM | POA: Diagnosis not present

## 2022-05-31 LAB — TYPE AND SCREEN
ABO/RH(D): O POS
Antibody Screen: NEGATIVE
Unit division: 0

## 2022-05-31 LAB — BPAM RBC
Blood Product Expiration Date: 202404232359
ISSUE DATE / TIME: 202403232107
Unit Type and Rh: 5100

## 2022-05-31 LAB — BASIC METABOLIC PANEL
Anion gap: 8 (ref 5–15)
BUN: 9 mg/dL (ref 8–23)
CO2: 25 mmol/L (ref 22–32)
Calcium: 8.4 mg/dL — ABNORMAL LOW (ref 8.9–10.3)
Chloride: 101 mmol/L (ref 98–111)
Creatinine, Ser: 0.56 mg/dL (ref 0.44–1.00)
GFR, Estimated: >60 mL/min (ref >60)
Glucose, Bld: 85 mg/dL (ref 70–99)
Potassium: 3.3 mmol/L — ABNORMAL LOW (ref 3.5–5.1)
Sodium: 134 mmol/L — ABNORMAL LOW (ref 135–145)

## 2022-05-31 LAB — VITAMIN B12: Vitamin B-12: 185 pg/mL (ref 180–914)

## 2022-05-31 LAB — CBC
HCT: 20.6 % — ABNORMAL LOW (ref 36.0–46.0)
HCT: 28.5 % — ABNORMAL LOW (ref 36.0–46.0)
Hemoglobin: 6.6 g/dL — CL (ref 12.0–15.0)
Hemoglobin: 9.1 g/dL — ABNORMAL LOW (ref 12.0–15.0)
MCH: 30 pg (ref 26.0–34.0)
MCH: 29.4 pg (ref 26.0–34.0)
MCHC: 32 g/dL (ref 30.0–36.0)
MCHC: 31.9 g/dL (ref 30.0–36.0)
MCV: 93.6 fL (ref 80.0–100.0)
MCV: 91.9 fL (ref 80.0–100.0)
Platelets: 286 10*3/uL (ref 150–400)
Platelets: 282 10*3/uL (ref 150–400)
RBC: 2.2 MIL/uL — ABNORMAL LOW (ref 3.87–5.11)
RBC: 3.1 MIL/uL — ABNORMAL LOW (ref 3.87–5.11)
RDW: 14.6 % (ref 11.5–15.5)
RDW: 14.9 % (ref 11.5–15.5)
WBC: 12.7 10*3/uL — ABNORMAL HIGH (ref 4.0–10.5)
WBC: 10 10*3/uL (ref 4.0–10.5)
nRBC: 0 % (ref 0.0–0.2)
nRBC: 0 % (ref 0.0–0.2)

## 2022-05-31 LAB — IRON AND TIBC
Iron: 30 ug/dL (ref 28–170)
Saturation Ratios: 11 % (ref 10.4–31.8)
TIBC: 281 ug/dL (ref 250–450)
UIBC: 251 ug/dL

## 2022-05-31 LAB — FOLATE: Folate: 10.2 ng/mL (ref >5.9)

## 2022-05-31 LAB — C-REACTIVE PROTEIN: CRP: 19.6 mg/dL — ABNORMAL HIGH (ref <1.0)

## 2022-05-31 MED ORDER — SODIUM CHLORIDE 0.9 % IV SOLN
2.0000 g | Freq: Three times a day (TID) | INTRAVENOUS | Status: DC
  Administered 2022-05-31: 2 g via INTRAVENOUS
  Filled 2022-05-31: qty 12.5

## 2022-05-31 MED ORDER — OXYCODONE HCL 5 MG PO TABS
10.0000 mg | ORAL_TABLET | ORAL | Status: DC | PRN
  Administered 2022-05-31 – 2022-06-04 (×14): 10 mg via ORAL
  Filled 2022-05-31 (×14): qty 2

## 2022-05-31 MED ORDER — METHADONE HCL 10 MG PO TABS
110.0000 mg | ORAL_TABLET | Freq: Every day | ORAL | Status: DC
  Administered 2022-05-31 – 2022-06-04 (×5): 110 mg via ORAL
  Filled 2022-05-31 (×5): qty 11

## 2022-05-31 MED ORDER — POTASSIUM CHLORIDE CRYS ER 20 MEQ PO TBCR
40.0000 meq | EXTENDED_RELEASE_TABLET | ORAL | Status: AC
  Administered 2022-05-31 (×2): 40 meq via ORAL
  Filled 2022-05-31 (×2): qty 2

## 2022-05-31 MED ORDER — OXYCODONE HCL 5 MG PO TABS
5.0000 mg | ORAL_TABLET | ORAL | Status: DC | PRN
  Administered 2022-05-31: 5 mg via ORAL
  Filled 2022-05-31: qty 1

## 2022-05-31 NOTE — Discharge Summary (Signed)
Physician Discharge Summary  Patient ID: Future Haus MRN: [id number] DOB/AGE: 05/16/1955 67 y.o.  Admit date: 05/27/2022 Discharge date: 05/28/2022  Admission Diagnoses:  Osteoarthritis of right hip  Discharge Diagnoses:  Principal Problem:   Osteoarthritis of right hip   Past Medical History:  Diagnosis Date   Arthritis    Asthma    childhood   Hepatitis    Hypertension     Surgeries: Procedure(s): TOTAL HIP ARTHROPLASTY ANTERIOR APPROACH on 05/27/2022   Consultants (if any):   Discharged Condition: Improved  Hospital Course: Kara Fox is an 67 y.o. female who was admitted 05/27/2022 with a diagnosis of Osteoarthritis of right hip and went to the operating room on 05/27/2022 and underwent the above named procedures.    She was given perioperative antibiotics:  Anti-infectives (From admission, onward)    Start     Dose/Rate Route Frequency Ordered Stop   05/27/22 1830  ceFAZolin (ANCEF) IVPB 2g/100 mL premix        2 g 200 mL/hr over 30 Minutes Intravenous Every 6 hours 05/27/22 1745 05/28/22 0040   05/27/22 1015  ceFAZolin (ANCEF) IVPB 2g/100 mL premix        2 g 200 mL/hr over 30 Minutes Intravenous On call to O.R. 05/27/22 1003 05/27/22 1237       She was given sequential compression devices, early ambulation, and aspirin for DVT prophylaxis.  POD patient has [place] ppx femur fracture. Stem stable. WBAT w/walker. No active abduction for 6 weeks.  POD#1 Patient ambulated 40 and 130 feet with PT. Patient discharged home, avoid active abduction for 6 weeks. Patient to follow-up in office in 2 weeks.   She benefited maximally from the hospital stay and there were no complications.    Recent vital signs:  Vitals:   05/28/22 0629 05/28/22 1402  BP: (!) 145/65 (!) 144/76  Pulse: 62 63  Resp: 16 18  Temp: 98 F (36.7 C) 98.2 F (36.8 C)  SpO2: 100% 98%    Recent laboratory studies:  Lab Results  Component Value Date   HGB 9.1 (L)  05/31/2022   HGB 6.6 (LL) 05/31/2022   HGB 6.6 (LL) 05/30/2022   Lab Results  Component Value Date   WBC 10.0 05/31/2022   PLT 282 05/31/2022   No results found for: "INR" Lab Results  Component Value Date   NA 134 (L) 05/31/2022   K 3.3 (L) 05/31/2022   CL 101 05/31/2022   CO2 25 05/31/2022   BUN 9 05/31/2022   CREATININE 0.56 05/31/2022   GLUCOSE 85 05/31/2022     Allergies as of 05/28/2022   No Known Allergies      Medication List     TAKE these medications    aspirin 81 MG chewable tablet Commonly known as: Aspirin Childrens Chew 1 tablet (81 mg total) by mouth 2 (two) times daily with a meal.   docusate sodium 100 MG capsule Commonly known as: Colace Take 1 capsule (100 mg total) by mouth 2 (two) times daily.   HYDROcodone-acetaminophen 5-325 MG tablet Commonly known as: NORCO/VICODIN Take 1 tablet by mouth every 4 (four) hours as needed for moderate pain or severe pain.   ibuprofen 200 MG tablet Commonly known as: ADVIL Take 200-400 mg by mouth every 6 (six) hours as needed for moderate pain.   methadone 10 MG/ML solution Commonly known as: DOLOPHINE Take 110 mg by mouth in the morning.   methocarbamol 500 MG tablet Commonly known as: ROBAXIN Take  1 tablet (500 mg total) by mouth every 6 (six) hours as needed for muscle spasms.   ondansetron 4 MG tablet Commonly known as: Zofran Take 1 tablet (4 mg total) by mouth every 8 (eight) hours as needed for nausea or vomiting.   polyethylene glycol 17 g packet Commonly known as: MiraLax Take 17 g by mouth daily as needed for mild constipation or moderate constipation.   senna 8.6 MG Tabs tablet Commonly known as: SENOKOT Take 2 tablets (17.2 mg total) by mouth at bedtime for 15 days.               Discharge Care Instructions  (From admission, onward)           Start     Ordered   05/28/22 0000  Weight bearing as tolerated       Comments: Avoid active hip abduction for 6 weeks on right  lower extremity.  No standing side leg raises.  Question Answer Comment  Laterality right   Extremity Lower      05/28/22 1104   05/28/22 0000  Change dressing       Comments: Do not change your dressing.   05/28/22 1104              WEIGHT BEARING   Weight bearing as tolerated with assist device (walker, cane, etc) as directed, use it as long as suggested by your surgeon or therapist, typically at least 4-6 weeks. Avoid active abduction for 6 weeks.    EXERCISES  Results after joint replacement surgery are often greatly improved when you follow the exercise, range of motion and muscle strengthening exercises prescribed by your doctor. Safety measures are also important to protect the joint from further injury. Any time any of these exercises cause you to have increased pain or swelling, decrease what you are doing until you are comfortable again and then slowly increase them. If you have problems or questions, call your caregiver or physical therapist for advice.   Rehabilitation is important following a joint replacement. After just a few days of immobilization, the muscles of the leg can become weakened and shrink (atrophy).  These exercises are designed to build up the tone and strength of the thigh and leg muscles and to improve motion. Often times heat used for twenty to thirty minutes before working out will loosen up your tissues and help with improving the range of motion but do not use heat for the first two weeks following surgery (sometimes heat can increase post-operative swelling).   These exercises can be done on a training (exercise) mat, on the floor, on a table or on a bed. Use whatever works the best and is most comfortable for you.    Use music or television while you are exercising so that the exercises are a pleasant break in your day. This will make your life better with the exercises acting as a break in your routine that you can look forward to.   Perform all  exercises about fifteen times, three times per day or as directed.  You should exercise both the operative leg and the other leg as well.  Exercises include:   Quad Sets - Tighten up the muscle on the front of the thigh (Quad) and hold for 5-10 seconds.   Straight Leg Raises - With your knee straight (if you were given a brace, keep it on), lift the leg to 60 degrees, hold for 3 seconds, and slowly lower the leg.  Perform  this exercise against resistance later as your leg gets stronger.  Leg Slides: Lying on your back, slowly slide your foot toward your buttocks, bending your knee up off the floor (only go as far as is comfortable). Then slowly slide your foot back down until your leg is flat on the floor again.  Angel Wings: Lying on your back spread your legs to the side as far apart as you can without causing discomfort.  Hamstring Strength:  Lying on your back, push your heel against the floor with your leg straight by tightening up the muscles of your buttocks.  Repeat, but this time bend your knee to a comfortable angle, and push your heel against the floor.  You may put a pillow under the heel to make it more comfortable if necessary.   A rehabilitation program following joint replacement surgery can speed recovery and prevent re-injury in the future due to weakened muscles. Contact your doctor or a physical therapist for more information on knee rehabilitation.    CONSTIPATION  Constipation is defined medically as fewer than three stools per week and severe constipation as less than one stool per week.  Even if you have a regular bowel pattern at home, your normal regimen is likely to be disrupted due to multiple reasons following surgery.  Combination of anesthesia, postoperative narcotics, change in appetite and fluid intake all can affect your bowels.   YOU MUST use at least one of the following options; they are listed in order of increasing strength to get the job done.  They are all  available over the counter, and you may need to use some, POSSIBLY even all of these options:    Drink plenty of fluids (prune juice may be helpful) and high fiber foods Colace 100 mg by mouth twice a day  Senokot for constipation as directed and as needed Dulcolax (bisacodyl), take with full glass of water  Miralax (polyethylene glycol) once or twice a day as needed.  If you have tried all these things and are unable to have a bowel movement in the first 3-4 days after surgery call either your surgeon or your primary doctor.    If you experience loose stools or diarrhea, hold the medications until you stool forms back up.  If your symptoms do not get better within 1 week or if they get worse, check with your doctor.  If you experience "the worst abdominal pain ever" or develop nausea or vomiting, please contact the office immediately for further recommendations for treatment.   ITCHING:  If you experience itching with your medications, try taking only a single pain pill, or even half a pain pill at a time.  You can also use Benadryl over the counter for itching or also to help with sleep.   TED HOSE STOCKINGS:  Use stockings on both legs until for at least 2 weeks or as directed by physician office. They may be removed at night for sleeping.  MEDICATIONS:  See your medication summary on the "After Visit Summary" that nursing will review with you.  You may have some home medications which will be placed on hold until you complete the course of blood thinner medication.  It is important for you to complete the blood thinner medication as prescribed.  PRECAUTIONS:  If you experience chest pain or shortness of breath - call 911 immediately for transfer to the hospital emergency department.   If you develop a fever greater that 101 F, purulent drainage from wound,  increased redness or drainage from wound, foul odor from the wound/dressing, or calf pain - CONTACT YOUR SURGEON.                                                    FOLLOW-UP APPOINTMENTS:  If you do not already have a post-op appointment, please call the office for an appointment to be seen by your surgeon.  Guidelines for how soon to be seen are listed in your "After Visit Summary", but are typically between 1-4 weeks after surgery.  OTHER INSTRUCTIONS:   Knee Replacement:  Do not place pillow under knee, focus on keeping the knee straight while resting. CPM instructions: 0-90 degrees, 2 hours in the morning, 2 hours in the afternoon, and 2 hours in the evening. Place foam block, curve side up under heel at all times except when in CPM or when walking.  DO NOT modify, tear, cut, or change the foam block in any way.   MAKE SURE YOU:  Understand these instructions.  Get help right away if you are not doing well or get worse.    Thank you for letting us be a part of your medical care team.  It is a privilege we respect greatly.  We hope these instructions will help you stay on track for a fast and full recovery!   Diagnostic Studies: DG Pelvis Portable  Result Date: 05/27/2022 CLINICAL DATA:  Postop RIGHT total arthroplasty EXAM: PORTABLE PELVIS 1-2 VIEWS COMPARISON:  04/09/2022, intraoperative views same day FINDINGS: RIGHT hip total arthroplasty. Single AP view demonstrates a lucency beneath the RIGHT greater trochanter which extends from the cortex to the prosthetic stem. IMPRESSION: Sub trochanteric fracture along the lateral margin of the RIGHT femur. RIGHT hip total arthroplasty These results will be called to the ordering clinician or representative by the Radiologist Assistant, and communication documented in the PACS or [employer]. Electronically Signed   By: Suzy Bouchard M.D.   On: 05/27/2022 16:24   DG HIP UNILAT WITH PELVIS 1V RIGHT  Result Date: 05/27/2022 CLINICAL DATA:  Intraoperative fluoroscopy for total right hip arthroplasty. EXAM: DG HIP (WITH OR WITHOUT PELVIS) 1V RIGHT COMPARISON:  Pelvis and left hip  radiographs 04/19/2022 FINDINGS: Images were performed intraoperatively without the presence of a radiologist. The patient is undergoing total right hip arthroplasty. Partial visualization of prior total left hip arthroplasty. No hardware complication is seen. Total fluoroscopy images: 7 Total fluoroscopy time: 21 seconds Total dose: Radiation Exposure Index (as provided by the fluoroscopic device): 2.20 mGy air Kerma Please see intraoperative findings for further detail. IMPRESSION: Intraoperative fluoroscopy for total right hip arthroplasty. Electronically Signed   By: Yvonne Kendall M.D.   On: 05/27/2022 14:58   DG C-Arm 1-60 Min-No Report  Result Date: 05/27/2022 Fluoroscopy was utilized by the requesting physician.  No radiographic interpretation.   DG C-Arm 1-60 Min-No Report  Result Date: 05/27/2022 Fluoroscopy was utilized by the requesting physician.  No radiographic interpretation.    Disposition: Discharge disposition: 01-Home or Self Care       Discharge Instructions     Call MD / Call 911   Complete by: As directed    If you experience chest pain or shortness of breath, CALL 911 and be transported to the hospital emergency room.  If you develope a fever above 101 F, pus (white  drainage) or increased drainage or redness at the wound, or calf pain, call your surgeon's office.   Change dressing   Complete by: As directed    Do not change your dressing.   Constipation Prevention   Complete by: As directed    Drink plenty of fluids.  Prune juice may be helpful.  You may use a stool softener, such as Colace (over the counter) 100 mg twice a day.  Use MiraLax (over the counter) for constipation as needed.   Diet - low sodium heart healthy   Complete by: As directed    Discharge instructions   Complete by: As directed    Elevate toes above nose. Use cryotherapy as needed for pain and swelling.  Avoid active abduction for 6 weeks.   Driving restrictions   Complete by: As  directed    No driving for 6 weeks   Increase activity slowly as tolerated   Complete by: As directed    Lifting restrictions   Complete by: As directed    No lifting for 6 weeks   Post-operative opioid taper instructions:   Complete by: As directed    POST-OPERATIVE OPIOID TAPER INSTRUCTIONS: It is important to wean off of your opioid medication as soon as possible. If you do not need pain medication after your surgery it is ok to stop day one. Opioids include: Codeine, Hydrocodone(Norco, Vicodin), Oxycodone(Percocet, oxycontin) and hydromorphone amongst others.  Long term and even short term use of opiods can cause: Increased pain response Dependence Constipation Depression Respiratory depression And more.  Withdrawal symptoms can include Flu like symptoms Nausea, vomiting And more Techniques to manage these symptoms Hydrate well Eat regular healthy meals Stay active Use relaxation techniques(deep breathing, meditating, yoga) Do Not substitute Alcohol to help with tapering If you have been on opioids for less than two weeks and do not have pain than it is ok to stop all together.  Plan to wean off of opioids This plan should start within one week post op of your joint replacement. Maintain the same interval or time between taking each dose and first decrease the dose.  Cut the total daily intake of opioids by one tablet each day Next start to increase the time between doses. The last dose that should be eliminated is the evening dose.      TED hose   Complete by: As directed    Use stockings (TED hose) for 2 weeks on both leg(s).  You may remove them at night for sleeping.   Weight bearing as tolerated   Complete by: As directed    Avoid active hip abduction for 6 weeks on right lower extremity.  No standing side leg raises.   Laterality: right   Extremity: Lower        Follow-up Information     Charlott Rakes, PA-C. Schedule an appointment as soon as possible  for a visit in 2 week(s).   Specialty: Orthopedic Surgery Why: For suture removal, For wound re-check Contact information: [address] [address] [id number]                  Signed: Charlott Rakes 05/31/2022, 2:58 PM

## 2022-05-31 NOTE — Progress Notes (Signed)
PROGRESS NOTE    Kara Fox  [id number] DOB: Jul 03, 1955 DOA: 05/30/2022 PCP: Benito Mccreedy, MD  Outpatient Specialists:     Brief Narrative:  Patient is a 67 year old female with past medical history significant for opioid use disorder on methadone, hypertension, hyperlipidemia, NASH, chronic hepatitis C and recent right hip arthroplasty.  Patient was admitted with right hip pain.  On presentation, I hemoglobin was found to 6.6 g/dL swelling of the right thigh was reported, however, orthopedic team has reassured that there was normal thigh appearance postop.  Patient's right hip pain will be optimized.  Repeat hemoglobin revealed hemoglobin of 9.1 g/dL.  Patient will be discharged back home once pain is controlled.    Assessment & Plan:   Principal Problem:   Wound cellulitis Active Problems:   Normocytic anemia   History of total right hip arthroplasty   History of opioid abuse (HCC)   Right thigh swelling: -Orthopedic team also reviewed the right thigh. -Appearance is said to be normal, considering postoperative state. -Continue postop care  Anemia: -Likely acute blood loss anemia following surgery. -Hemoglobin of 6.6 g/dL on presentation. -Transfused with 1 unit of packed red blood cell. -Repeat hemoglobin is greater than 9 g/dL. -Continue to monitor closely.  Right hip pain: -Continue to optimize. -Patient is on methadone. -Discharge once pain is controlled.  History of opioid abuse: -See above documentation.  DVT prophylaxis: SCD. Code Status: Full code. Family Communication:  Disposition Plan:    Consultants:  Orthopedic  Procedures:  None  Antimicrobials:  Discontinued IV cefepime and vancomycin.  Subjective: Right hip pain.  Objective: Vitals:   05/30/22 2224 05/31/22 0006 05/31/22 0619 05/31/22 1511  BP:  (!) 142/74 (!) 154/74 134/78  Pulse:  76 80 88  Resp:  18 18 18   Temp: 100 F (37.8 C) 98.7 F (37.1 C) 99.1 F (37.3  C) 98.8 F (37.1 C)  TempSrc: Oral  Oral Oral  SpO2:  100% 100% 98%  Weight:      Height:        Intake/Output Summary (Last 24 hours) at 05/31/2022 1537 Last data filed at 05/31/2022 0600 Gross per 24 hour  Intake 624 ml  Output 300 ml  Net 324 ml   Filed Weights   05/30/22 1654  Weight: 62.1 kg    Examination:  General exam: Appears calm and comfortable  Respiratory system: Clear to auscultation.  Cardiovascular system: S1 & S2 heard Gastrointestinal system: Abdomen is nondistended, soft and nontender.  Central nervous system: Alert and oriented. Moves all extremities.. Extremities: Swelling of the right thigh.      Data Reviewed: I have personally reviewed following labs and imaging studies  CBC: Recent Labs  Lab 05/28/22 0329 05/30/22 1708 05/31/22 0342 05/31/22 1301  WBC 11.4* 10.4 12.7* 10.0  HGB 7.5* 6.6* 6.6* 9.1*  HCT 24.2* 21.7* 20.6* 28.5*  MCV 95.7 98.2 93.6 91.9  PLT 239 180 286 [id number]   Basic Metabolic Panel: Recent Labs  Lab 05/28/22 0329 05/30/22 1708 05/31/22 0342  NA 136 137 134*  K 4.2 3.9 3.3*  CL 102 103 101  CO2 27 23 25   GLUCOSE 161* 94 85  BUN 14 10 9   CREATININE 0.78 0.49 0.56  CALCIUM 8.2* 8.7* 8.4*   GFR: Estimated Creatinine Clearance: 60 mL/min (by C-G formula based on SCr of 0.56 mg/dL). Liver Function Tests: No results for input(s): "AST", "ALT", "ALKPHOS", "BILITOT", "PROT", "ALBUMIN" in the last 168 hours. No results for input(s): "LIPASE", "AMYLASE" in the  last 168 hours. No results for input(s): "AMMONIA" in the last 168 hours. Coagulation Profile: No results for input(s): "INR", "PROTIME" in the last 168 hours. Cardiac Enzymes: No results for input(s): "CKTOTAL", "CKMB", "CKMBINDEX", "TROPONINI" in the last 168 hours. BNP (last 3 results) No results for input(s): "PROBNP" in the last 8760 hours. HbA1C: No results for input(s): "HGBA1C" in the last 72 hours. CBG: No results for input(s): "GLUCAP" in the last  168 hours. Lipid Profile: No results for input(s): "CHOL", "HDL", "LDLCALC", "TRIG", "CHOLHDL", "LDLDIRECT" in the last 72 hours. Thyroid Function Tests: No results for input(s): "TSH", "T4TOTAL", "FREET4", "T3FREE", "THYROIDAB" in the last 72 hours. Anemia Panel: Recent Labs    05/31/22 0342  VITAMINB12 185  FOLATE 10.2  TIBC 281  IRON 30   Urine analysis: No results found for: "COLORURINE", "APPEARANCEUR", "LABSPEC", "PHURINE", "GLUCOSEU", "HGBUR", "BILIRUBINUR", "KETONESUR", "PROTEINUR", "UROBILINOGEN", "NITRITE", "LEUKOCYTESUR" Sepsis Labs: @LABRCNTIP (procalcitonin:4,lacticidven:4)  ) Recent Results (from the past 240 hour(s))  Blood culture (routine x 2)     Status: None (Preliminary result)   Collection Time: 05/30/22  7:30 PM   Specimen: BLOOD  Result Value Ref Range Status   Specimen Description   Final    BLOOD Performed at [facility], [place] [address] [place], [place] [address]    Special Requests   Final    BOTTLES DRAWN AEROBIC AND ANAEROBIC Blood Culture adequate volume Performed at [facility] [address], [place], [place] [address]    Culture   Final    NO GROWTH < 12 HOURS Performed at [facility] [address], [place], [place] [address]    Report Status PENDING  Incomplete         Radiology Studies: No results found.      Scheduled Meds:  aspirin  81 mg Oral Daily   methadone  110 mg Oral Daily   Continuous Infusions:  ceFEPime (MAXIPIME) IV 2 g (05/31/22 1001)   vancomycin       LOS: 1 day    Time spent: 35 Minutes.    Dana Allan, MD  [facility] Pager #: [phone] 7PM-7AM contact night coverage as above

## 2022-05-31 NOTE — Consult Note (Signed)
ORTHOPAEDIC CONSULTATION  REQUESTING PHYSICIAN: Bonnell Public, MD  PCP:  Benito Mccreedy, MD  Chief Complaint: Increased thigh pain and swelling.   HPI: Kara Fox is a 67 y.o. female medical history significant of opioid use disorder on methadone, HTN, HLD, NASH, chronic hepatitis C, and recent right total hip arthoplasty 05/27/22 with Dr. Lyla Glassing. She was discharged 05/28/22. She presented to the ED 05/30/22 with increase right hip pain and swelling. She denies any lower calf pain or swelling.  Has been compliant with her daily aspirin 81mg  BID for DVT prophylaxis. In the ED, initially afebrile but later developed temperature 100.8 degrees F. She was admitted with concerns for cellulitis and low hemoglobin 6.6 (last recorded hemoglobin 7.5 05/28/22, prior to discharge). Orthopedics consulted for evaluation of right hip.   She also had recent left total hip arthroplasty 04/10/22 with Dr. Lyla Glassing, doing well. Patient reports that she has been ambulating at home with her walker and trying to increase activity. She has been wearing her compression stockings. She admits to not elevating or using ice frequently. She also admits that she has not been using her incentive spirometry 10x per hour at home since discharge. She currently denies any calf pain, chest pain, SOB, dizziness, N/V. She states she has not had her baseline dose of methadone since admission. She denies any tingling or numbness in LE bilaterally.     Past Medical History:  Diagnosis Date   Arthritis    Asthma    childhood   Hepatitis    Hypertension    Past Surgical History:  Procedure Laterality Date   CESAREAN SECTION     HERNIA REPAIR     TOTAL HIP ARTHROPLASTY Left 04/09/2022   Procedure: TOTAL HIP ARTHROPLASTY ANTERIOR APPROACH;  Surgeon: Rod Can, MD;  Location: [facility];  Service: Orthopedics;  Laterality: Left;  125   TOTAL HIP ARTHROPLASTY Right 05/27/2022   Procedure: TOTAL HIP ARTHROPLASTY  ANTERIOR APPROACH;  Surgeon: Rod Can, MD;  Location: [facility];  Service: Orthopedics;  Laterality: Right;  140 can use Collins time for flip room   Social History   Socioeconomic History   Marital status: Widowed    Spouse name: Not on file   Number of children: Not on file   Years of education: Not on file   Highest education level: Not on file  Occupational History   Not on file  Tobacco Use   Smoking status: Former    Packs/day: 0.25    Years: 35.00    Additional pack years: 0.00    Total pack years: 8.75    Types: Cigarettes    Quit date: 01/21/2022    Years since quitting: 0.3   Smokeless tobacco: Never  Vaping Use   Vaping Use: Some days   Substances: Nicotine, Flavoring  Substance and Sexual Activity   Alcohol use: Not Currently   Drug use: Not Currently    Comment: Heroin   Sexual activity: Not on file  Other Topics Concern   Not on file  Social History Narrative   Not on file   Social Determinants of Health   Financial Resource Strain: Not on file  Food Insecurity: No Food Insecurity (05/31/2022)   Hunger Vital Sign    Worried About Running Out of Food in the Last Year: Never true    Ran Out of Food in the Last Year: Never true  Transportation Needs: No Transportation Needs (05/31/2022)   PRAPARE - Transportation    Lack of  Transportation (Medical): No    Lack of Transportation (Non-Medical): No  Physical Activity: Not on file  Stress: Not on file  Social Connections: Not on file   Family History  Family history unknown: Yes   No Known Allergies Prior to Admission medications   Medication Sig Start Date End Date Taking? Authorizing Provider  aspirin (ASPIRIN CHILDRENS) 81 MG chewable tablet Chew 1 tablet (81 mg total) by mouth 2 (two) times daily with a meal. 05/28/22 07/12/22 Yes Brissia Delisa, Marciano Sequin, PA-C  docusate sodium (COLACE) 100 MG capsule Take 1 capsule (100 mg total) by mouth 2 (two) times daily. 05/28/22 06/27/22 Yes Caylen Yardley, Marciano Sequin, PA-C   HYDROcodone-acetaminophen (NORCO/VICODIN) 5-325 MG tablet Take 1 tablet by mouth every 4 (four) hours as needed for moderate pain or severe pain. 05/28/22 05/28/23 Yes Shaquavia Whisonant, Marciano Sequin, PA-C  ibuprofen (ADVIL) 200 MG tablet Take 200-400 mg by mouth every 6 (six) hours as needed for moderate pain.   Yes [provider]  methadone (DOLOPHINE) 10 MG/ML solution Take 110 mg by mouth in the morning.   Yes [provider]  methocarbamol (ROBAXIN) 500 MG tablet Take 1 tablet (500 mg total) by mouth every 6 (six) hours as needed for muscle spasms. 05/28/22  Yes Gavriella Hearst, Marciano Sequin, PA-C  ondansetron (ZOFRAN) 4 MG tablet Take 1 tablet (4 mg total) by mouth every 8 (eight) hours as needed for nausea or vomiting. 05/28/22 05/28/23 Yes Thierry Dobosz, Marciano Sequin, PA-C  polyethylene glycol (MIRALAX) 17 g packet Take 17 g by mouth daily as needed for mild constipation or moderate constipation. 05/28/22 06/27/22 Yes Luther Newhouse, Marciano Sequin, PA-C  senna (SENOKOT) 8.6 MG TABS tablet Take 2 tablets (17.2 mg total) by mouth at bedtime for 15 days. 05/28/22 06/12/22 Yes Davie Claud, Marciano Sequin, PA-C   No results found.  Positive ROS: All other systems have been reviewed and were otherwise negative with the exception of those mentioned in the HPI and as above.  Physical Exam: General: Alert, no acute distress Cardiovascular: No pedal edema Respiratory: No cyanosis, no use of accessory musculature GI: No organomegaly, abdomen is soft and non-tender Neurologic: Sensation intact distally Psychiatric: Patient is competent for consent with normal mood and affect Lymphatic: No axillary or cervical lymphadenopathy  MUSCULOSKELETAL: Examination of the right hip reveals a benign incision with dermabond glue in place. Aquacel dressing removed. No drainage, or erythema. No increased warmth when compared bilaterally. No wound dehiscence. No subcutaneous fluid collection. Slight pain with hip movement as to be expected postoperative. Mild expected  postoperative thigh swelling. New aquacel dressing placed today.   Sensory and motor function intact in LE bilaterally including plantar flexion, dorsiflexion and EHL. Distal pedal pulses 2+ bilaterally. She is wearing her compression socks. Calves soft and non-tender.   Assessment: S/p right total hip arthroplasty, 05/27/22, doing well. No evidence of infection or VTE.  Mild expected postoperative thigh swelling ABLA  Plan: - Patient has normal postoperative examination of her right hip. Benign incision, no drainage or erythema, no wound dehiscence, no subcutaneous fluid collection. No increased warmth when compared bilaterally. New Aquacel dressing placed. Please leave intact. She has mild expected thigh swelling that is appropriate post-operatively, no calf pain or tenderness.  - ABLA, hemoglobin 6.6 today down from 7.5 05/28/22. Patient asymptomatic. She had low hemoglobin prior to surgery 05/27/22 at 9.6, likely also ABLA from left total hip arthroplasty 04/09/22. Defer to [facility] about blood transfusion.   - Patient temperature 100.8 in the ED, last recorded temperature 99.2.  Patient admits to not using incentive spirometry at home. We discussed to use 10 times per hour as instructed.  - Increased thigh pain and swelling. She has mild expected thigh swelling. She reports increased pain. She is on methadone at baseline. She has not received baseline dose since admission. Methadone can be continued and oxycodone ordered for pain relief. She also reports she has not been elevating and using ice regularly. We discussed continued use of her compression socks, ice, and elevation. Continue ankle pumps to help with circulation and swelling. WBAT w/ walker, continue to avoid active abduction for at least 6 weeks.  - Discussed patient with Dr. Lyla Glassing, no further imaging necessary. No Further orthopedic intervention at this time. - Patient can follow-up in office as scheduled for 2 week post-op visit.       Charlott Rakes, PA-C    05/31/2022 12:17 PM

## 2022-06-01 DIAGNOSIS — L0390 Cellulitis, unspecified: Secondary | ICD-10-CM | POA: Diagnosis not present

## 2022-06-01 LAB — CBC WITH DIFFERENTIAL/PLATELET
Abs Immature Granulocytes: 0.04 10*3/uL (ref 0.00–0.07)
Basophils Absolute: 0 10*3/uL (ref 0.0–0.1)
Basophils Relative: 0 %
Eosinophils Absolute: 0.2 10*3/uL (ref 0.0–0.5)
Eosinophils Relative: 3 %
HCT: 27.5 % — ABNORMAL LOW (ref 36.0–46.0)
Hemoglobin: 8.7 g/dL — ABNORMAL LOW (ref 12.0–15.0)
Immature Granulocytes: 1 %
Lymphocytes Relative: 22 %
Lymphs Abs: 1.6 10*3/uL (ref 0.7–4.0)
MCH: 29.3 pg (ref 26.0–34.0)
MCHC: 31.6 g/dL (ref 30.0–36.0)
MCV: 92.6 fL (ref 80.0–100.0)
Monocytes Absolute: 0.8 10*3/uL (ref 0.1–1.0)
Monocytes Relative: 11 %
Neutro Abs: 4.8 10*3/uL (ref 1.7–7.7)
Neutrophils Relative %: 63 %
Platelets: 297 10*3/uL (ref 150–400)
RBC: 2.97 MIL/uL — ABNORMAL LOW (ref 3.87–5.11)
RDW: 14.9 % (ref 11.5–15.5)
WBC: 7.6 10*3/uL (ref 4.0–10.5)
nRBC: 0 % (ref 0.0–0.2)

## 2022-06-01 LAB — RENAL FUNCTION PANEL
Albumin: 2.9 g/dL — ABNORMAL LOW (ref 3.5–5.0)
Anion gap: 9 (ref 5–15)
BUN: 11 mg/dL (ref 8–23)
CO2: 25 mmol/L (ref 22–32)
Calcium: 8.4 mg/dL — ABNORMAL LOW (ref 8.9–10.3)
Chloride: 103 mmol/L (ref 98–111)
Creatinine, Ser: 0.65 mg/dL (ref 0.44–1.00)
GFR, Estimated: >60 mL/min (ref >60)
Glucose, Bld: 122 mg/dL — ABNORMAL HIGH (ref 70–99)
Phosphorus: 2.3 mg/dL — ABNORMAL LOW (ref 2.5–4.6)
Potassium: 3.9 mmol/L (ref 3.5–5.1)
Sodium: 137 mmol/L (ref 135–145)

## 2022-06-01 LAB — MAGNESIUM: Magnesium: 2.1 mg/dL (ref 1.7–2.4)

## 2022-06-01 NOTE — Plan of Care (Signed)
  Problem: Education: Goal: Knowledge of the prescribed therapeutic regimen will improve Outcome: Progressing   Problem: Activity: Goal: Ability to tolerate increased activity will improve Outcome: Progressing   Problem: Pain Management: Goal: Pain level will decrease with appropriate interventions Outcome: Progressing   Problem: Safety: Goal: Ability to remain free from injury will improve Outcome: Progressing   

## 2022-06-01 NOTE — TOC CM/SW Note (Signed)
Transition of Care [facility]) Screening Note  Patient Details  Name: Kara Fox Date of Birth: 11/03/1955  Transition of Care [facility]) CM/SW Contact:    Sherie Don, LCSW Phone Number: 06/01/2022, 10:04 AM  Transition of Care Department [facility]) has reviewed patient and no TOC needs have been identified at this time. We will continue to monitor patient advancement through interdisciplinary progression rounds. If new patient transition needs arise, please place a TOC consult.

## 2022-06-01 NOTE — Evaluation (Signed)
Physical Therapy Evaluation Patient Details Name: Kara Fox MRN: [id number] DOB: [birth year] Today's Date: 06/01/2022  History of Present Illness  Pt is a 67 year old female with recent right total hip arthoplasty 05/27/22 with Dr. Lyla Glassing. She had post op periprosthetic greater trochanter fracture - no active ABDuction, WBAT.  She was discharged 05/28/22. She presented to the ED 05/30/22 with increase right hip pain and swelling.  PMHx: L THA 04/09/22, HTN, asthma, Hepatitis C, opioid use disorder on methadone  Clinical Impression  Pt admitted with above diagnosis.  Pt currently with functional limitations due to the deficits listed below (see PT Problem List). Pt will benefit from acute skilled PT to increase their independence and safety with mobility to allow discharge.  Pt only able to ambulate a few feet and reports mobility limited by edema and pain in right hip and thigh at this time.  Pt repositioned OOB in recliner with elevated R LE, ice, and incentive spirometer in reach.        Recommendations for follow up therapy are one component of a multi-disciplinary discharge planning process, led by the attending physician.  Recommendations may be updated based on patient status, additional functional criteria and insurance authorization.  Follow Up Recommendations       Assistance Recommended at Discharge Intermittent Supervision/Assistance  Patient can return home with the following  A little help with walking and/or transfers;A little help with bathing/dressing/bathroom;Assistance with cooking/housework;Assist for transportation;Help with stairs or ramp for entrance    Equipment Recommendations None recommended by PT  Recommendations for Other Services       Functional Status Assessment Patient has had a recent decline in their functional status and demonstrates the ability to make significant improvements in function in a reasonable and predictable amount of time.      Precautions / Restrictions Precautions Precautions: Fall;Other (comment) Precaution Comments: no active ABDUCTION R hip for 6 weeks 2* periprosthetic fx Restrictions Other Position/Activity Restrictions: WBAT      Mobility  Bed Mobility   Bed Mobility: Supine to Sit     Supine to sit: Min guard, HOB elevated     General bed mobility comments: cues for use of gait belt to self assist R LE exiting Rt side of bed (no active hip abduction)    Transfers Overall transfer level: Needs assistance Equipment used: Rolling walker (2 wheels) Transfers: Sit to/from Stand Sit to Stand: Min guard           General transfer comment: verbal cues for UE and LE positioning as pt did not recall from recent admission    Ambulation/Gait Ambulation/Gait assistance: Min guard Gait Distance (Feet): 5 Feet Assistive device: Rolling walker (2 wheels) Gait Pattern/deviations: Step-to pattern, Decreased stance time - right, Antalgic       General Gait Details: verbal cues for sequence, RW positioning, step length; pt only able to take a few steps forwards and then backwards to recliner due to reported increase in pain  Stairs            Wheelchair Mobility    Modified Rankin (Stroke Patients Only)       Balance                                             Pertinent Vitals/Pain Pain Assessment Pain Assessment: 0-10 Pain Score: 8  Pain Location: R hip Pain Descriptors /  Indicators: Burning, Pressure Pain Intervention(s): Repositioned, Monitored during session, Premedicated before session, Ice applied    Home Living Family/patient expects to be discharged to:: Private residence Living Arrangements: Children Available Help at Discharge: Family;Available 24 hours/day Type of Home: House Home Access: Stairs to enter Entrance Stairs-Rails: None Entrance Stairs-Number of Steps: 2   Home Layout: One level Home Equipment: Cane - single Barista  (2 wheels);BSC/3in1      Prior Function Prior Level of Function : Independent/Modified Independent             Mobility Comments: independent prior to THA       Hand Dominance        Extremity/Trunk Assessment        Lower Extremity Assessment Lower Extremity Assessment: RLE deficits/detail RLE Deficits / Details: right thigh swelling observed, pt able to perform ankle pumps, post op weakness and decreased AROM due to edema, did not perform hip abd per precautions    Cervical / Trunk Assessment Cervical / Trunk Assessment: Normal  Communication   Communication: No difficulties  Cognition Arousal/Alertness: Awake/alert Behavior During Therapy: WFL for tasks assessed/performed Overall Cognitive Status: Within Functional Limits for tasks assessed                                          General Comments      Exercises Total Joint Exercises Ankle Circles/Pumps: AROM, Both, 10 reps   Assessment/Plan    PT Assessment Patient needs continued PT services  PT Problem List Decreased strength;Decreased activity tolerance;Decreased mobility;Decreased safety awareness;Pain;Decreased knowledge of use of DME       PT Treatment Interventions DME instruction;Stair training;Therapeutic activities;Gait training;Functional mobility training;Therapeutic exercise;Patient/family education    PT Goals (Current goals can be found in the Care Plan section)  Acute Rehab PT Goals PT Goal Formulation: With patient Time For Goal Achievement: 06/15/22 Potential to Achieve Goals: Good    Frequency Min 5X/week     Co-evaluation               AM-PAC PT "6 Clicks" Mobility  Outcome Measure Help needed turning from your back to your side while in a flat bed without using bedrails?: None Help needed moving from lying on your back to sitting on the side of a flat bed without using bedrails?: A Little Help needed moving to and from a bed to a chair (including a  wheelchair)?: A Little Help needed standing up from a chair using your arms (e.g., wheelchair or bedside chair)?: A Little Help needed to walk in hospital room?: A Little Help needed climbing 3-5 steps with a railing? : A Lot 6 Click Score: 18    End of Session Equipment Utilized During Treatment: Gait belt Activity Tolerance: Patient tolerated treatment well Patient left: in chair;with call bell/phone within reach;with chair alarm set Nurse Communication: Mobility status PT Visit Diagnosis: Difficulty in walking, not elsewhere classified (R26.2);Pain Pain - Right/Left: Right Pain - part of body: Hip    Time: [id number] PT Time Calculation (min) (ACUTE ONLY): 17 min   Charges:   PT Evaluation $PT Eval Low Complexity: 1 Low        Kati PT, DPT Physical Therapist Acute Rehabilitation Services Preferred contact method: Secure Chat Weekend Pager Only: [phone] Office: [address] 06/01/2022, 12:16 PM

## 2022-06-01 NOTE — Progress Notes (Signed)
PROGRESS NOTE    Kara Fox  [id number] DOB: Sep 10, 1955 DOA: 05/30/2022 PCP: Benito Mccreedy, MD   Brief Narrative:  This 67 year old female with past medical history significant for opioid use disorder on methadone, hypertension, hyperlipidemia, NASH, chronic hepatitis C and recent right hip arthroplasty. Patient was admitted with right hip pain. On presentation, Her hemoglobin was found to 6.6 g/dL swelling of the right thigh was reported, however, orthopedic team has reassured that there was normal thigh appearance postop. Patient's right hip pain will be optimized. Repeat hemoglobin revealed hemoglobin of 9.1 g/dL. Patient will be discharged back home once pain is controlled.   Assessment & Plan:   Principal Problem:   Wound cellulitis Active Problems:   Normocytic anemia   History of total right hip arthroplasty   History of opioid abuse (HCC)   Right thigh swelling: -Orthopedic team  was consulted and has evaluated the patient -Appearance is said to be normal, considering postoperative state. -Continue postop care. -Mobilize out of bed with PT.   Normochromic, Normocytic Anemia: -Likely acute blood loss anemia following surgery. -Hemoglobin of 6.6 g/dL on presentation. -s/p 1 unit PRBC  -Repeat hemoglobin is greater than 9 g/dL. -Continue to monitor closely.   Right hip pain:> Improving -Continue to optimize adequate pain control. -Patient is on methadone. -Discharge once pain is controlled.   History of opioid abuse: -See above documentation.  DVT prophylaxis:  SCDs Code Status: Full code Family Communication: No family at bed side. Disposition Plan:   Status is: Inpatient Remains inpatient appropriate because: Admitted for right thigh swelling and anemia requiring blood transfusion.   Consultants:  Orthopedics  Procedures: None  Antimicrobials: Abx discontinued.   Subjective: Patient was seen and examined at bedside.  Overnight events  noted.   Patient reports pain has improved, RLE  swelling is also improved.  Objective: Vitals:   05/31/22 1511 05/31/22 2154 06/01/22 0744 06/01/22 1255  BP: 134/78 96/83 131/72 (!) 112/95  Pulse: 88 81 86 85  Resp: 18 18 18 18   Temp: 98.8 F (37.1 C) 98.4 F (36.9 C) 98.8 F (37.1 C) 97.8 F (36.6 C)  TempSrc: Oral Oral Oral   SpO2: 98% 100% 100% 100%  Weight:      Height:        Intake/Output Summary (Last 24 hours) at 06/01/2022 1343 Last data filed at 06/01/2022 0200 Gross per 24 hour  Intake 460 ml  Output --  Net 460 ml   Filed Weights   05/30/22 1654  Weight: 62.1 kg    Examination:  General exam: Appears calm and comfortable, not in any acute distress. Respiratory system: Clear to auscultation. Respiratory effort normal.  RR 15 Cardiovascular system: S1 & S2 heard, RRR. No JVD, murmurs, rubs, gallops or clicks. No pedal edema. Gastrointestinal system: Abdomen is soft, non tender, non distended, BS+ Central nervous system: Alert and oriented x 3. No focal neurological deficits. Extremities: RLE swelling > Improving Skin: No rashes, lesions or ulcers Psychiatry: Judgement and insight appear normal. Mood & affect appropriate.     Data Reviewed: I have personally reviewed following labs and imaging studies  CBC: Recent Labs  Lab 05/28/22 0329 05/30/22 1708 05/31/22 0342 05/31/22 1301 06/01/22 0300  WBC 11.4* 10.4 12.7* 10.0 7.6  NEUTROABS  --   --   --   --  4.8  HGB 7.5* 6.6* 6.6* 9.1* 8.7*  HCT 24.2* 21.7* 20.6* 28.5* 27.5*  MCV 95.7 98.2 93.6 91.9 92.6  PLT 239 180 286 282  [id number]   Basic Metabolic Panel: Recent Labs  Lab 05/28/22 0329 05/30/22 1708 05/31/22 0342 06/01/22 0300  NA 136 137 134* 137  K 4.2 3.9 3.3* 3.9  CL 102 103 101 103  CO2 27 23 25 25   GLUCOSE 161* 94 85 122*  BUN 14 10 9 11   CREATININE 0.78 0.49 0.56 0.65  CALCIUM 8.2* 8.7* 8.4* 8.4*  MG  --   --   --  2.1  PHOS  --   --   --  2.3*   GFR: Estimated Creatinine  Clearance: 60 mL/min (by C-G formula based on SCr of 0.65 mg/dL). Liver Function Tests: Recent Labs  Lab 06/01/22 0300  ALBUMIN 2.9*   No results for input(s): "LIPASE", "AMYLASE" in the last 168 hours. No results for input(s): "AMMONIA" in the last 168 hours. Coagulation Profile: No results for input(s): "INR", "PROTIME" in the last 168 hours. Cardiac Enzymes: No results for input(s): "CKTOTAL", "CKMB", "CKMBINDEX", "TROPONINI" in the last 168 hours. BNP (last 3 results) No results for input(s): "PROBNP" in the last 8760 hours. HbA1C: No results for input(s): "HGBA1C" in the last 72 hours. CBG: No results for input(s): "GLUCAP" in the last 168 hours. Lipid Profile: No results for input(s): "CHOL", "HDL", "LDLCALC", "TRIG", "CHOLHDL", "LDLDIRECT" in the last 72 hours. Thyroid Function Tests: No results for input(s): "TSH", "T4TOTAL", "FREET4", "T3FREE", "THYROIDAB" in the last 72 hours. Anemia Panel: Recent Labs    05/31/22 0342  VITAMINB12 185  FOLATE 10.2  TIBC 281  IRON 30   Sepsis Labs: Recent Labs  Lab 05/30/22 1930  LATICACIDVEN 1.1    Recent Results (from the past 240 hour(s))  Blood culture (routine x 2)     Status: None (Preliminary result)   Collection Time: 05/30/22  7:30 PM   Specimen: BLOOD  Result Value Ref Range Status   Specimen Description   Final    BLOOD Performed at [facility] [address], [place], [place] [address]    Special Requests   Final    BOTTLES DRAWN AEROBIC AND ANAEROBIC Blood Culture adequate volume Performed at [facility] [address] [place], [place] [address]    Culture   Final    NO GROWTH 1 DAY Performed at [facility], [place] [address] [place], [place] [address]    Report Status PENDING  Incomplete  Blood culture (routine x 2)     Status: None (Preliminary result)   Collection Time: 05/31/22  3:42 AM   Specimen: BLOOD  Result Value Ref Range Status   Specimen  Description   Final    BLOOD Performed at [facility] [address], [place], [place] [address]    Special Requests   Final    BOTTLES DRAWN AEROBIC AND ANAEROBIC Blood Culture adequate volume Performed at [facility] [address], [place], [place] [address]    Culture   Final    NO GROWTH < 24 HOURS Performed at [facility] [address] [place], [place] [address]    Report Status PENDING  Incomplete    Radiology Studies: No results found.  Scheduled Meds:  aspirin  81 mg Oral Daily   methadone  110 mg Oral Daily   Continuous Infusions:   LOS: 2 days    Time spent: 50 Mins    Duard Brady, MD [facility]   If 7PM-7AM, please contact night-coverage

## 2022-06-02 DIAGNOSIS — L0390 Cellulitis, unspecified: Secondary | ICD-10-CM | POA: Diagnosis not present

## 2022-06-02 LAB — HEMOGLOBIN AND HEMATOCRIT, BLOOD
HCT: 28.2 % — ABNORMAL LOW (ref 36.0–46.0)
Hemoglobin: 8.6 g/dL — ABNORMAL LOW (ref 12.0–15.0)

## 2022-06-02 NOTE — Progress Notes (Signed)
PROGRESS NOTE    Kara Fox  [id number] DOB: [birth year] DOA: 05/30/2022 PCP: Benito Mccreedy, MD   Brief Narrative:  This 67 year old female with past medical history significant for opioid use disorder on methadone, hypertension, hyperlipidemia, NASH, chronic hepatitis C and recent right hip arthroplasty. Patient was admitted with right hip pain. On presentation, Her hemoglobin was found to 6.6 g/dL swelling of the right thigh was reported, however, orthopedic team has reassured that there was normal thigh appearance postop. Patient's right hip pain will be optimized. Repeat hemoglobin revealed hemoglobin of 9.1 g/dL. Patient will be discharged back home once pain is controlled.   Assessment & Plan:   Principal Problem:   Wound cellulitis Active Problems:   Normocytic anemia   History of total right hip arthroplasty   History of opioid abuse (HCC)  Right thigh swelling: -Orthopedic team was consulted and has evaluated the patient -Appearance is said to be normal, considering postoperative state. -Continue postop care. -There is no evidence of infection so antibiotics were discontinued. -Mobilize out of bed with PT.   Normochromic, Normocytic Anemia: -Likely acute blood loss anemia following surgery. -Hemoglobin of 6.6 g/dL on presentation. -s/p 1 unit PRBC > Hb improved. -Repeat hemoglobin is greater than 9 g/dL. -Continue to monitor closely.   Right hip pain:> Improving -Continue to optimize adequate pain control. -Patient is on methadone. -Discharge once pain is controlled.   History of opioid abuse: -See above documentation.  DVT prophylaxis:  SCDs Code Status: Full code Family Communication: No family at bed side. Disposition Plan:   Status is: Inpatient Remains inpatient appropriate because: Admitted for right thigh swelling and anemia requiring blood transfusion.   Consultants:  Orthopedics  Procedures: None  Antimicrobials: Abx  discontinued.  Subjective: Patient was seen and examined at bedside.  Overnight events noted.   Patient reports her right thigh pain has improved.  Pain is now reasonably improved  Objective: Vitals:   06/01/22 1813 06/01/22 2052 06/01/22 2300 06/02/22 0503  BP: 136/78 (!) 145/70  129/77  Pulse: 82 91  74  Resp: 17 18  17   Temp: 99.7 F (37.6 C) (!) 101.2 F (38.4 C) 99.2 F (37.3 C) 99.8 F (37.7 C)  TempSrc: Oral Oral Oral Oral  SpO2: 100% 100%  100%  Weight:      Height:        Intake/Output Summary (Last 24 hours) at 06/02/2022 1129 Last data filed at 06/02/2022 0834 Gross per 24 hour  Intake 840 ml  Output 400 ml  Net 440 ml   Filed Weights   05/30/22 1654  Weight: 62.1 kg    Examination:  General exam: Appears comfortable, not in any acute distress. Respiratory system: CTA Bilaterally. Respiratory effort normal.  RR 14 Cardiovascular system: S1 & S2 heard, regular rate and rhythm, no murmur. Gastrointestinal system: Abdomen is soft, non tender, non distended, BS+ Central nervous system: Alert and oriented x 3. No focal neurological deficits. Extremities: RLE swelling > Improving Skin: No rashes, lesions or ulcers Psychiatry: Judgement and insight appear normal. Mood & affect appropriate.    Data Reviewed: I have personally reviewed following labs and imaging studies  CBC: Recent Labs  Lab 05/28/22 0329 05/30/22 1708 05/31/22 0342 05/31/22 1301 06/01/22 0300 06/02/22 0327  WBC 11.4* 10.4 12.7* 10.0 7.6  --   NEUTROABS  --   --   --   --  4.8  --   HGB 7.5* 6.6* 6.6* 9.1* 8.7* 8.6*  HCT 24.2* 21.7* 20.6* 28.5*  27.5* 28.2*  MCV 95.7 98.2 93.6 91.9 92.6  --   PLT 239 180 286 282 297  --    Basic Metabolic Panel: Recent Labs  Lab 05/28/22 0329 05/30/22 1708 05/31/22 0342 06/01/22 0300  NA 136 137 134* 137  K 4.2 3.9 3.3* 3.9  CL 102 103 101 103  CO2 27 23 25 25   GLUCOSE 161* 94 85 122*  BUN 14 10 9 11   CREATININE 0.78 0.49 0.56 0.65   CALCIUM 8.2* 8.7* 8.4* 8.4*  MG  --   --   --  2.1  PHOS  --   --   --  2.3*   GFR: Estimated Creatinine Clearance: 60 mL/min (by C-G formula based on SCr of 0.65 mg/dL). Liver Function Tests: Recent Labs  Lab 06/01/22 0300  ALBUMIN 2.9*   No results for input(s): "LIPASE", "AMYLASE" in the last 168 hours. No results for input(s): "AMMONIA" in the last 168 hours. Coagulation Profile: No results for input(s): "INR", "PROTIME" in the last 168 hours. Cardiac Enzymes: No results for input(s): "CKTOTAL", "CKMB", "CKMBINDEX", "TROPONINI" in the last 168 hours. BNP (last 3 results) No results for input(s): "PROBNP" in the last 8760 hours. HbA1C: No results for input(s): "HGBA1C" in the last 72 hours. CBG: No results for input(s): "GLUCAP" in the last 168 hours. Lipid Profile: No results for input(s): "CHOL", "HDL", "LDLCALC", "TRIG", "CHOLHDL", "LDLDIRECT" in the last 72 hours. Thyroid Function Tests: No results for input(s): "TSH", "T4TOTAL", "FREET4", "T3FREE", "THYROIDAB" in the last 72 hours. Anemia Panel: Recent Labs    05/31/22 0342  VITAMINB12 185  FOLATE 10.2  TIBC 281  IRON 30   Sepsis Labs: Recent Labs  Lab 05/30/22 1930  LATICACIDVEN 1.1    Recent Results (from the past 240 hour(s))  Blood culture (routine x 2)     Status: None (Preliminary result)   Collection Time: 05/30/22  7:30 PM   Specimen: BLOOD  Result Value Ref Range Status   Specimen Description   Final    BLOOD Performed at [facility] [address], [place], [place] [address]    Special Requests   Final    BOTTLES DRAWN AEROBIC AND ANAEROBIC Blood Culture adequate volume Performed at [facility] [address] [place], [place] [address]    Culture   Final    NO GROWTH 2 DAYS Performed at [facility] [address], [place], [place] [address]    Report Status PENDING  Incomplete  Blood culture (routine x 2)     Status: None (Preliminary  result)   Collection Time: 05/31/22  3:42 AM   Specimen: BLOOD  Result Value Ref Range Status   Specimen Description   Final    BLOOD Performed at [facility] [address] [place], [place] [address]    Special Requests   Final    BOTTLES DRAWN AEROBIC AND ANAEROBIC Blood Culture adequate volume Performed at [facility] [address] [place], [place] [address]    Culture   Final    NO GROWTH 2 DAYS Performed at [facility] [address], [place], [place] [address]    Report Status PENDING  Incomplete    Radiology Studies: No results found.  Scheduled Meds:  aspirin  81 mg Oral Daily   methadone  110 mg Oral Daily   Continuous Infusions:   LOS: 3 days    Time spent: 35 Mins  Duard Brady, MD [facility]   If 7PM-7AM, please contact night-coverage

## 2022-06-02 NOTE — Progress Notes (Signed)
Physical Therapy Treatment Patient Details Name: Kara Fox MRN: [id number] DOB: [birth year] Today's Date: 06/02/2022   History of Present Illness Pt is a 67 year old female with recent right total hip arthoplasty 05/27/22 with Dr. Lyla Glassing. She had post op periprosthetic greater trochanter fracture - no active ABDuction, WBAT.  She was discharged 05/28/22. She presented to the ED 05/30/22 with increase right hip pain and swelling.  PMHx: L THA 04/09/22, HTN, asthma, Hepatitis C, opioid use disorder on methadone    PT Comments    Assisted OOB to amb in hallway and practice stairs.  Re educated on "NO active ABduction".  Demonstrated and instructed on how to use belt to self assist LE off bed.  Then returned to room to perform some TE's followed by ICE.      Recommendations for follow up therapy are one component of a multi-disciplinary discharge planning process, led by the attending physician.  Recommendations may be updated based on patient status, additional functional criteria and insurance authorization.  Follow Up Recommendations       Assistance Recommended at Discharge Intermittent Supervision/Assistance  Patient can return home with the following A little help with walking and/or transfers;A little help with bathing/dressing/bathroom;Assistance with cooking/housework;Assist for transportation;Help with stairs or ramp for entrance   Equipment Recommendations  None recommended by PT    Recommendations for Other Services       Precautions / Restrictions Precautions Precautions: Fall Precaution Comments: no active ABDUCTION R hip for 6 weeks 2* periprosthetic fx Restrictions Weight Bearing Restrictions: No Other Position/Activity Restrictions: WBAT     Mobility  Bed Mobility Overal bed mobility: Needs Assistance Bed Mobility: Supine to Sit     Supine to sit: Min guard, HOB elevated     General bed mobility comments: cues for use of gait belt to self assist R LE  exiting Rt side of bed (no active hip abduction)    Transfers Overall transfer level: Needs assistance Equipment used: Rolling walker (2 wheels) Transfers: Sit to/from Stand Sit to Stand: Min guard, Supervision           General transfer comment: verbal cues for UE and LE positioning plus increased time    Ambulation/Gait Ambulation/Gait assistance: Supervision, Min guard Gait Distance (Feet): 24 Feet Assistive device: Rolling walker (2 wheels) Gait Pattern/deviations: Step-to pattern, Decreased stance time - right, Antalgic Gait velocity: decr     General Gait Details: verbal cues for sequence, RW positioning, step length; tolerated an increased distance.   Stairs Stairs: Yes Stairs assistance: Min assist Stair Management: No rails, Forwards, With walker Number of Stairs: 3 General stair comments: VCs sequencing, min A to steady RW (up forward/down forward with second assist securing walker)   Wheelchair Mobility    Modified Rankin (Stroke Patients Only)       Balance                                            Cognition Arousal/Alertness: Awake/alert Behavior During Therapy: WFL for tasks assessed/performed Overall Cognitive Status: Within Functional Limits for tasks assessed                                 General Comments: AxO x 3 pleasant [facility]        Exercises  10 reps AP  10 reps  knee presses  10 reps gluteal squeezes    General Comments        Pertinent Vitals/Pain Pain Assessment Pain Assessment: 0-10 Pain Score: 5  Pain Location: R hip Pain Descriptors / Indicators: Burning, Pressure, Operative site guarding Pain Intervention(s): Monitored during session, Premedicated before session, Repositioned, Ice applied    Home Living                          Prior Function            PT Goals (current goals can now be found in the care plan section) Progress towards PT goals: Progressing toward  goals    Frequency    Min 5X/week      PT Plan Current plan remains appropriate    Co-evaluation              AM-PAC PT "6 Clicks" Mobility   Outcome Measure  Help needed turning from your back to your side while in a flat bed without using bedrails?: A Little Help needed moving from lying on your back to sitting on the side of a flat bed without using bedrails?: A Little Help needed moving to and from a bed to a chair (including a wheelchair)?: A Little Help needed standing up from a chair using your arms (e.g., wheelchair or bedside chair)?: A Little Help needed to walk in hospital room?: A Little Help needed climbing 3-5 steps with a railing? : A Lot 6 Click Score: 17    End of Session Equipment Utilized During Treatment: Gait belt Activity Tolerance: Patient tolerated treatment well Patient left: in chair;with call bell/phone within reach;with chair alarm set Nurse Communication: Mobility status PT Visit Diagnosis: Difficulty in walking, not elsewhere classified (R26.2);Pain Pain - Right/Left: Right Pain - part of body: Hip     Time: [id number] PT Time Calculation (min) (ACUTE ONLY): 41 min  Charges:  $Gait Training: 8-22 mins $Therapeutic Exercise: 8-22 mins $Therapeutic Activity: 8-22 mins                     {Cloud Graham  PTA Acute  [employer] M-F          [phone]

## 2022-06-02 NOTE — Progress Notes (Signed)
Physical Therapy Treatment Patient Details Name: Kara Fox MRN: [id number] DOB: [birth year] Today's Date: 06/02/2022   History of Present Illness Pt is a 67 year old female with recent right total hip arthoplasty 05/27/22 with Dr. Lyla Glassing. She had post op periprosthetic greater trochanter fracture - no active ABDuction, WBAT.  She was discharged 05/28/22. She presented to the ED 05/30/22 with increase right hip pain and swelling.  PMHx: L THA 04/09/22, HTN, asthma, Hepatitis C, opioid use disorder on methadone    PT Comments    Very pleasant Lady Re Admit.  Assisted with amb in hallway then back to bed.  Positioned to comfort and applied ICE.  Pt plans to D/C to home potentially tomorrow.   Recommendations for follow up therapy are one component of a multi-disciplinary discharge planning process, led by the attending physician.  Recommendations may be updated based on patient status, additional functional criteria and insurance authorization.  Follow Up Recommendations       Assistance Recommended at Discharge Intermittent Supervision/Assistance  Patient can return home with the following A little help with walking and/or transfers;A little help with bathing/dressing/bathroom;Assistance with cooking/housework;Assist for transportation;Help with stairs or ramp for entrance   Equipment Recommendations  None recommended by PT    Recommendations for Other Services       Precautions / Restrictions Precautions Precautions: Fall Precaution Comments: no active ABDUCTION R hip for 6 weeks 2* periprosthetic fx Restrictions Weight Bearing Restrictions: No Other Position/Activity Restrictions: WBAT     Mobility  Bed Mobility Overal bed mobility: Needs Assistance Bed Mobility: Sit to Supine     Supine to sit: Min guard, HOB elevated    General bed mobility comments: assisted back to bed using belt to self assist then positioned to comfort.    Transfers Overall transfer  level: Needs assistance Equipment used: Rolling walker (2 wheels) Transfers: Sit to/from Stand Sit to Stand: Min guard, Supervision           General transfer comment: verbal cues for UE and LE positioning plus increased time    Ambulation/Gait Ambulation/Gait assistance: Supervision, Min guard Gait Distance (Feet): 38 Feet Assistive device: Rolling walker (2 wheels) Gait Pattern/deviations: Step-to pattern, Decreased stance time - right, Antalgic Gait velocity: decr     General Gait Details: verbal cues for sequence, RW positioning, step length; tolerated an increased distance.   Stairs   Wheelchair Mobility    Modified Rankin (Stroke Patients Only)       Balance                                            Cognition Arousal/Alertness: Awake/alert Behavior During Therapy: WFL for tasks assessed/performed Overall Cognitive Status: Within Functional Limits for tasks assessed                                 General Comments: AxO x 3 pleasant Lady        Exercises      General Comments        Pertinent Vitals/Pain Pain Assessment Pain Assessment: 0-10 Pain Score: 5  Pain Location: R hip Pain Descriptors / Indicators: Burning, Pressure, Operative site guarding Pain Intervention(s): Monitored during session, Premedicated before session, Repositioned, Ice applied    Home Living  Prior Function            PT Goals (current goals can now be found in the care plan section) Progress towards PT goals: Progressing toward goals    Frequency    Min 5X/week      PT Plan Current plan remains appropriate    Co-evaluation              AM-PAC PT "6 Clicks" Mobility   Outcome Measure  Help needed turning from your back to your side while in a flat bed without using bedrails?: A Little Help needed moving from lying on your back to sitting on the side of a flat bed without using  bedrails?: A Little Help needed moving to and from a bed to a chair (including a wheelchair)?: A Little Help needed standing up from a chair using your arms (e.g., wheelchair or bedside chair)?: A Little Help needed to walk in hospital room?: A Little Help needed climbing 3-5 steps with a railing? : A Lot 6 Click Score: 17    End of Session Equipment Utilized During Treatment: Gait belt Activity Tolerance: Patient tolerated treatment well Patient left: in chair;with call bell/phone within reach;with chair alarm set Nurse Communication: Mobility status PT Visit Diagnosis: Difficulty in walking, not elsewhere classified (R26.2);Pain Pain - Right/Left: Right Pain - part of body: Hip     Time: [id number] PT Time Calculation (min) (ACUTE ONLY): 20 min  Charges:  $Gait Training: 8-22 mins                       Rica Koyanagi  PTA [facility] M-F          [phone]

## 2022-06-02 NOTE — TOC Initial Note (Signed)
Transition of Care [facility]) - Initial/Assessment Note   Patient Details  Name: Kara Fox MRN: [id number] Date of Birth: 1955/04/10  [facility]) CM/SW Contact:    Sherie Don, LCSW Phone Number: 06/02/2022, 3:22 PM  Clinical Narrative: Patient was seen by TOC on 05/28/22 after her hip surgery. Patient went home with a home exercise program (HEP) at that time. PT evaluation recommended HHPT. Patient is agreeable to referrals, but is aware it may not be accepted as several [facility] agencies are not in-network with the managed Medicaid plans. CSW made referrals to the following agencies:  [facility]: declined [facility]: declined [facility]: declined [facility]: awaiting response [facility]: declined [facility]: out of network [facility]: out of [facility], [facility]: declined [facility]: will review referral. Referral sent for review.  Expected Discharge Plan: [place] Barriers to Discharge: No [facility] will accept this patient  Patient Goals and CMS Choice Patient states their goals for this hospitalization and ongoing recovery are:: Get HHPT if possible CMS Medicare.gov Compare Post Acute Care list provided to:: Patient Choice offered to / list presented to : Patient  Expected Discharge Plan and Services In-house Referral: Clinical Social Work (Has rolling walker and 3N1) Post Acute Care Choice: [place] arrangements for the past 2 months: [place]  Prior Living Arrangements/Services Living arrangements for the past 2 months: Single Family Home Patient language and need for interpreter reviewed:: Yes Do you feel safe going back to the place where you live?: Yes      Need for Family Participation in Patient Care: Yes (Comment) Care giver support system in place?: Yes (comment) Criminal Activity/Legal Involvement Pertinent to Current Situation/Hospitalization: No - Comment as needed  Activities of Daily Living Home Assistive  Devices/Equipment: Cane (specify quad or straight), Walker (specify type), Wheelchair, Shower chair with back, Dentures (specify type), Bedside commode/3-in-1 ADL Screening (condition at time of admission) Patient's cognitive ability adequate to safely complete daily activities?: Yes Is the patient deaf or have difficulty hearing?: No Does the patient have difficulty seeing, even when wearing glasses/contacts?: No Does the patient have difficulty concentrating, remembering, or making decisions?: No Patient able to express need for assistance with ADLs?: Yes Does the patient have difficulty dressing or bathing?: No Independently performs ADLs?: Yes (appropriate for developmental age) Does the patient have difficulty walking or climbing stairs?: Yes Weakness of Legs: Right Weakness of Arms/Hands: None  Permission Sought/Granted Permission sought to share information with : Other (comment) Permission granted to share information with : Yes, Verbal Permission Granted Permission granted to share info w AGENCY: [facility] agencies  Emotional Assessment Attitude/Demeanor/Rapport: Engaged Affect (typically observed): Accepting Orientation: : Oriented to Self, Oriented to Place, Oriented to  Time, Oriented to Situation Alcohol / Substance Use: Not Applicable Psych Involvement: No (comment)  Admission diagnosis:  Postoperative anemia [D64.9] Cellulitis of right lower extremity [L03.115] Wound cellulitis [L03.90] Patient Active Problem List   Diagnosis Date Noted   Wound cellulitis 05/30/2022   Normocytic anemia 05/30/2022   History of total right hip arthroplasty 05/30/2022   History of opioid abuse ([place]) 05/30/2022   Osteoarthritis of right hip 05/27/2022   Osteoarthritis of left hip 04/09/2022   PCP:  Benito Mccreedy, MD Pharmacy:   [id number] - [place], [place] [address] Phone: [phone] Fax:  [phone]  Social Determinants of Health (SDOH) Social History: SDOH Screenings   Food Insecurity: No  Food Insecurity (05/31/2022)  Housing: Low Risk  (05/31/2022)  Transportation Needs: No Transportation Needs (05/31/2022)  Utilities: Not At Risk (05/31/2022)  Tobacco Use: Medium Risk (05/30/2022)   SDOH Interventions:    Readmission Risk Interventions     No data to display

## 2022-06-03 DIAGNOSIS — L0390 Cellulitis, unspecified: Secondary | ICD-10-CM | POA: Diagnosis not present

## 2022-06-03 LAB — HEMOGLOBIN AND HEMATOCRIT, BLOOD
HCT: 26.6 % — ABNORMAL LOW (ref 36.0–46.0)
Hemoglobin: 8.3 g/dL — ABNORMAL LOW (ref 12.0–15.0)

## 2022-06-03 MED ORDER — SENNA 8.6 MG PO TABS
1.0000 | ORAL_TABLET | Freq: Every day | ORAL | Status: DC
  Administered 2022-06-03 – 2022-06-04 (×2): 8.6 mg via ORAL
  Filled 2022-06-03 (×2): qty 1

## 2022-06-03 MED ORDER — DOCUSATE SODIUM 100 MG PO CAPS
100.0000 mg | ORAL_CAPSULE | Freq: Two times a day (BID) | ORAL | Status: DC
  Administered 2022-06-03 – 2022-06-04 (×3): 100 mg via ORAL
  Filled 2022-06-03 (×3): qty 1

## 2022-06-03 MED ORDER — POLYETHYLENE GLYCOL 3350 17 G PO PACK
17.0000 g | PACK | Freq: Every day | ORAL | Status: DC
  Administered 2022-06-03 – 2022-06-04 (×2): 17 g via ORAL
  Filled 2022-06-03 (×2): qty 1

## 2022-06-03 NOTE — Progress Notes (Signed)
Physical Therapy Treatment Patient Details Name: Kara Fox MRN: [id number] DOB: 01-25-1956 Today's Date: 06/03/2022   History of Present Illness Pt is a 67 year old female with recent right total hip arthoplasty 05/27/22 with Dr. Lyla Glassing. She had post op periprosthetic greater trochanter fracture - no active ABDuction, WBAT.  She was discharged 05/28/22. She presented to the ED 05/30/22 with increase right hip pain and swelling.  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 Reviewed HEP, pt demonstrates good understanding.    Recommendations for follow up therapy are one component of a multi-disciplinary discharge planning process, led by the attending physician.  Recommendations may be updated based on patient status, additional functional criteria and insurance authorization.  Follow Up Recommendations       Assistance Recommended at Discharge Intermittent Supervision/Assistance  Patient can return home with the following A little help with walking and/or transfers;A little help with bathing/dressing/bathroom;Assistance with cooking/housework;Assist for transportation;Help with stairs or ramp for entrance   Equipment Recommendations  None recommended by PT    Recommendations for Other Services       Precautions / Restrictions Precautions Precautions: Fall Precaution Comments: no active ABDUCTION R hip for 6 weeks 2* periprosthetic fx Restrictions Weight Bearing Restrictions: No Other Position/Activity Restrictions: WBAT     Mobility  Bed Mobility Overal bed mobility: Modified Independent Bed Mobility: Supine to Sit     Supine to sit: HOB elevated, Modified independent (Device/Increase time)     General bed mobility comments: up in recliner    Transfers Overall transfer level: Needs assistance Equipment used: Rolling walker (2 wheels) Transfers:  Sit to/from Stand Sit to Stand: Supervision           General transfer comment: verbal cues for UE and LE positioning plus increased time    Ambulation/Gait Ambulation/Gait assistance: Supervision Gait Distance (Feet): 85 Feet Assistive device: Rolling walker (2 wheels) Gait Pattern/deviations: Step-to pattern, Decreased stance time - right, Antalgic Gait velocity: decr     General Gait Details: verbal cues for sequence, RW positioning, step length; tolerated an increased distance.   Stairs     Stair Management: Forwards       Wheelchair Mobility    Modified Rankin (Stroke Patients Only)       Balance Overall balance assessment: Modified Independent                                          Cognition Arousal/Alertness: Awake/alert Behavior During Therapy: WFL for tasks assessed/performed Overall Cognitive Status: Within Functional Limits for tasks assessed                                 General Comments: AxO x 3 pleasant Lady        Exercises Total Joint Exercises Ankle Circles/Pumps: AROM, Both, 10 reps Quad Sets: AROM, Both, 5 reps Short Arc Quad: AROM, Right, 10 reps Heel Slides: AAROM, Right, 10 reps Long Arc Quad: AROM, Right, Seated, 10 reps    General Comments        Pertinent Vitals/Pain Pain Assessment Pain Score: 6  Pain Location: R hip Pain Descriptors / Indicators: Sore Pain Intervention(s): Limited activity within patient's tolerance, Monitored during session, Premedicated before  session, Ice applied    Home Living                          Prior Function            PT Goals (current goals can now be found in the care plan section) Acute Rehab PT Goals Patient Stated Goal: go on a cruise in June PT Goal Formulation: With patient Time For Goal Achievement: 06/11/22 Potential to Achieve Goals: Good Progress towards PT goals: Progressing toward goals    Frequency    Min  5X/week      PT Plan Current plan remains appropriate    Co-evaluation              AM-PAC PT "6 Clicks" Mobility   Outcome Measure  Help needed turning from your back to your side while in a flat bed without using bedrails?: A Little Help needed moving from lying on your back to sitting on the side of a flat bed without using bedrails?: A Little Help needed moving to and from a bed to a chair (including a wheelchair)?: A Little Help needed standing up from a chair using your arms (e.g., wheelchair or bedside chair)?: A Little Help needed to walk in hospital room?: A Little Help needed climbing 3-5 steps with a railing? : A Little 6 Click Score: 18    End of Session Equipment Utilized During Treatment: Gait belt Activity Tolerance: Patient tolerated treatment well Patient left: in chair;with call bell/phone within reach;with chair alarm set Nurse Communication: Mobility status PT Visit Diagnosis: Difficulty in walking, not elsewhere classified (R26.2);Pain Pain - Right/Left: Right Pain - part of body: Hip     Time: [id number] PT Time Calculation (min) (ACUTE ONLY): 20 min  Charges:  $Gait Training: 8-22 mins                     Blondell Reveal Kistler PT 06/03/2022  Acute Rehabilitation Services  Office [phone]

## 2022-06-03 NOTE — Progress Notes (Signed)
PROGRESS NOTE    Kara Fox  [id number] DOB: 1956-01-02 DOA: 05/30/2022  PCP: Benito Mccreedy, MD   Brief Narrative:  This 67 year old female with past medical history significant for opioid use disorder on methadone, hypertension, hyperlipidemia, NASH, chronic hepatitis C and recent right hip arthroplasty. Patient was admitted with right hip pain. On presentation, Her hemoglobin was found to 6.6 g/dL swelling of the right thigh was reported, however, orthopedic team has reassured that there was normal thigh appearance postop. Patient's right hip pain will be optimized. Repeat hemoglobin revealed hemoglobin of 9.1 g/dL. Patient will be discharged back home once pain is controlled.   Assessment & Plan:   Principal Problem:   Wound cellulitis Active Problems:   Normocytic anemia   History of total right hip arthroplasty   History of opioid abuse (HCC)  Right thigh swelling: -Orthopedic team was consulted and has evaluated the patient -Appearance is said to be normal, considering postoperative state. -Continue postop care. -There is no evidence of infection so antibiotics were discontinued. -Mobilize out of bed with PT.   Normochromic, Normocytic Anemia: -Likely acute blood loss anemia following surgery. -Hemoglobin of 6.6 g/dL on presentation. -s/p 1 unit PRBC > Hb improved. -Repeat hemoglobin is greater than 9 g/dL. -Continue to monitor closely. Hb 8.3 today   Right hip pain:> Improving -Continue to optimize adequate pain control. -Patient is on methadone. -Discharge once pain is controlled.   History of opioid abuse: -See above documentation.  DVT prophylaxis:  SCDs Code Status: Full code Family Communication: No family at bed side. Disposition Plan:   Status is: Inpatient Remains inpatient appropriate because: Admitted for right thigh swelling and anemia requiring blood transfusion. Anticipated Discharge home tomorrow with home and services.    Consultants:  Orthopedics  Procedures: None  Antimicrobials: Abx discontinued.  Subjective: Patient was seen and examined at bedside.  Overnight events noted.   Patient reported right thigh pain has significantly improved. She has not participated with therapy yesterday.  Objective: Vitals:   06/02/22 0503 06/02/22 1423 06/02/22 2138 06/03/22 0547  BP: 129/77 127/66 (!) 141/68 133/83  Pulse: 74 85 91 92  Resp: 17 20 18 18   Temp: 99.8 F (37.7 C) 98.2 F (36.8 C) 99.2 F (37.3 C) 99 F (37.2 C)  TempSrc: Oral Oral Oral   SpO2: 100% 100% 100% 99%  Weight:      Height:        Intake/Output Summary (Last 24 hours) at 06/03/2022 1209 Last data filed at 06/03/2022 0550 Gross per 24 hour  Intake 140 ml  Output --  Net 140 ml   Filed Weights   05/30/22 1654  Weight: 62.1 kg    Examination:  General exam: Appears comfortable, not in any acute distress, deconditioned. Respiratory system: CTA Bilaterally. Respiratory effort normal.  RR 14 Cardiovascular system: S1 & S2 heard, regular rate and rhythm, no murmur. Gastrointestinal system: Abdomen is soft, non tender, non distended, BS+ Central nervous system: Alert and oriented x 3. No focal neurological deficits. Extremities: RLE swelling > improving. Skin: No rashes, lesions or ulcers Psychiatry: Judgement and insight appear normal. Mood & affect appropriate.    Data Reviewed: I have personally reviewed following labs and imaging studies  CBC: Recent Labs  Lab 05/28/22 0329 05/30/22 1708 05/31/22 0342 05/31/22 1301 06/01/22 0300 06/02/22 0327 06/03/22 0339  WBC 11.4* 10.4 12.7* 10.0 7.6  --   --   NEUTROABS  --   --   --   --  4.8  --   --  HGB 7.5* 6.6* 6.6* 9.1* 8.7* 8.6* 8.3*  HCT 24.2* 21.7* 20.6* 28.5* 27.5* 28.2* 26.6*  MCV 95.7 98.2 93.6 91.9 92.6  --   --   PLT 239 180 286 282 297  --   --    Basic Metabolic Panel: Recent Labs  Lab 05/28/22 0329 05/30/22 1708 05/31/22 0342 06/01/22 0300  NA  136 137 134* 137  K 4.2 3.9 3.3* 3.9  CL 102 103 101 103  CO2 27 23 25 25   GLUCOSE 161* 94 85 122*  BUN 14 10 9 11   CREATININE 0.78 0.49 0.56 0.65  CALCIUM 8.2* 8.7* 8.4* 8.4*  MG  --   --   --  2.1  PHOS  --   --   --  2.3*   GFR: Estimated Creatinine Clearance: 60 mL/min (by C-G formula based on SCr of 0.65 mg/dL). Liver Function Tests: Recent Labs  Lab 06/01/22 0300  ALBUMIN 2.9*   No results for input(s): "LIPASE", "AMYLASE" in the last 168 hours. No results for input(s): "AMMONIA" in the last 168 hours. Coagulation Profile: No results for input(s): "INR", "PROTIME" in the last 168 hours. Cardiac Enzymes: No results for input(s): "CKTOTAL", "CKMB", "CKMBINDEX", "TROPONINI" in the last 168 hours. BNP (last 3 results) No results for input(s): "PROBNP" in the last 8760 hours. HbA1C: No results for input(s): "HGBA1C" in the last 72 hours. CBG: No results for input(s): "GLUCAP" in the last 168 hours. Lipid Profile: No results for input(s): "CHOL", "HDL", "LDLCALC", "TRIG", "CHOLHDL", "LDLDIRECT" in the last 72 hours. Thyroid Function Tests: No results for input(s): "TSH", "T4TOTAL", "FREET4", "T3FREE", "THYROIDAB" in the last 72 hours. Anemia Panel: No results for input(s): "VITAMINB12", "FOLATE", "FERRITIN", "TIBC", "IRON", "RETICCTPCT" in the last 72 hours.  Sepsis Labs: Recent Labs  Lab 05/30/22 1930  LATICACIDVEN 1.1    Recent Results (from the past 240 hour(s))  Blood culture (routine x 2)     Status: None (Preliminary result)   Collection Time: 05/30/22  7:30 PM   Specimen: BLOOD  Result Value Ref Range Status   Specimen Description   Final    BLOOD Performed at [facility], [place] [address], [place], [place] [address]    Special Requests   Final    BOTTLES DRAWN AEROBIC AND ANAEROBIC Blood Culture adequate volume Performed at [facility] [address] [place], [place] [address]    Culture   Final    NO GROWTH 3  DAYS Performed at [facility], [place] [address], [place], [place] [address]    Report Status PENDING  Incomplete  Blood culture (routine x 2)     Status: None (Preliminary result)   Collection Time: 05/31/22  3:42 AM   Specimen: BLOOD  Result Value Ref Range Status   Specimen Description   Final    BLOOD Performed at [facility] [address], [place], [place] [address]    Special Requests   Final    BOTTLES DRAWN AEROBIC AND ANAEROBIC Blood Culture adequate volume Performed at [facility] [address], [place], [place] [address]    Culture   Final    NO GROWTH 3 DAYS Performed at [facility], [place] [address], [place], [place] [address]    Report Status PENDING  Incomplete    Radiology Studies: No results found.  Scheduled Meds:  aspirin  81 mg Oral Daily   docusate sodium  100 mg Oral BID  methadone  110 mg Oral Daily   polyethylene glycol  17 g Oral Daily   senna  1 tablet Oral Daily   Continuous Infusions:   LOS: 4 days    Time spent: 35 Mins    Duard Brady, MD [facility]   If 7PM-7AM, please contact night-coverage

## 2022-06-03 NOTE — TOC Progression Note (Signed)
Transition of Care [facility]) - Progression Note   Patient Details  Name: Kara Fox MRN: [id number] Date of Birth: 1955/12/19  Transition of Care [facility]) CM/SW [place], LCSW Phone Number: 06/03/2022, 9:53 AM  Clinical Narrative: [facility] and [facility] have declined the referral for HHPT. CSW updated patient and hospitalist that no [place] agency would accept the referral.    Expected Discharge Plan: [place] Barriers to Discharge: No [facility] will accept this patient  Expected Discharge Plan and Services In-house Referral: Clinical Social Work (Has rolling walker and 3N1) Post Acute Care Choice: [place] arrangements for the past 2 months: Single Family Home  Social Determinants of Health (SDOH) Interventions SDOH Screenings   Food Insecurity: No Food Insecurity (05/31/2022)  Housing: Low Risk  (05/31/2022)  Transportation Needs: No Transportation Needs (05/31/2022)  Utilities: Not At Risk (05/31/2022)  Tobacco Use: Medium Risk (05/30/2022)   Readmission Risk Interventions     No data to display

## 2022-06-03 NOTE — Progress Notes (Signed)
Physical Therapy Treatment Patient Details Name: Kara Fox MRN: [id number] DOB: 1955-12-03 Today's Date: 06/03/2022   History of Present Illness Pt is a 67 year old female with recent right total hip arthoplasty 05/27/22 with Dr. Lyla Glassing. She had post op periprosthetic greater trochanter fracture - no active ABDuction, WBAT.  She was discharged 05/28/22. She presented to the ED 05/30/22 with increase right hip pain and swelling.  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 She is adhering well to no active abduction of R hip precaution. Pain 7/10 with activity. Ice applied at end of session.    Recommendations for follow up therapy are one component of a multi-disciplinary discharge planning process, led by the attending physician.  Recommendations may be updated based on patient status, additional functional criteria and insurance authorization.  Follow Up Recommendations       Assistance Recommended at Discharge Intermittent Supervision/Assistance  Patient can return home with the following A little help with walking and/or transfers;A little help with bathing/dressing/bathroom;Assistance with cooking/housework;Assist for transportation;Help with stairs or ramp for entrance   Equipment Recommendations  None recommended by PT    Recommendations for Other Services       Precautions / Restrictions Precautions Precautions: Fall Precaution Comments: no active ABDUCTION R hip for 6 weeks 2* periprosthetic fx Restrictions Weight Bearing Restrictions: No Other Position/Activity Restrictions: WBAT     Mobility  Bed Mobility Overal bed mobility: Modified Independent Bed Mobility: Supine to Sit     Supine to sit: HOB elevated, Modified independent (Device/Increase time)     General bed mobility comments: good adherence to no hip abduction precaution     Transfers Overall transfer level: Needs assistance Equipment used: Rolling walker (2 wheels) Transfers: Sit to/from Stand Sit to Stand: Supervision           General transfer comment: verbal cues for UE and LE positioning plus increased time    Ambulation/Gait Ambulation/Gait assistance: Supervision Gait Distance (Feet): 70 Feet Assistive device: Rolling walker (2 wheels) Gait Pattern/deviations: Step-to pattern, Decreased stance time - right, Antalgic Gait velocity: decr     General Gait Details: verbal cues for sequence, RW positioning, step length; tolerated an increased distance.   Stairs     Stair Management: Forwards       Wheelchair Mobility    Modified Rankin (Stroke Patients Only)       Balance Overall balance assessment: Modified Independent                                          Cognition Arousal/Alertness: Awake/alert Behavior During Therapy: WFL for tasks assessed/performed Overall Cognitive Status: Within Functional Limits for tasks assessed                                 General Comments: AxO x 3 pleasant [facility]        Exercises      General Comments        Pertinent Vitals/Pain Pain Assessment Pain Score: 7  Pain Location: R hip Pain Descriptors / Indicators: Sore Pain Intervention(s): Limited activity within patient's tolerance, Monitored during session, Premedicated before session, Ice applied    Home Living  Prior Function            PT Goals (current goals can now be found in the care plan section) Acute Rehab PT Goals Patient Stated Goal: go on a cruise in June PT Goal Formulation: With patient Time For Goal Achievement: 06/11/22 Potential to Achieve Goals: Good Progress towards PT goals: Progressing toward goals    Frequency    Min 5X/week      PT Plan Current plan remains appropriate    Co-evaluation              AM-PAC PT  "6 Clicks" Mobility   Outcome Measure  Help needed turning from your back to your side while in a flat bed without using bedrails?: A Little Help needed moving from lying on your back to sitting on the side of a flat bed without using bedrails?: A Little Help needed moving to and from a bed to a chair (including a wheelchair)?: A Little Help needed standing up from a chair using your arms (e.g., wheelchair or bedside chair)?: A Little Help needed to walk in hospital room?: A Little Help needed climbing 3-5 steps with a railing? : A Little 6 Click Score: 18    End of Session Equipment Utilized During Treatment: Gait belt Activity Tolerance: Patient tolerated treatment well Patient left: in chair;with call bell/phone within reach;with chair alarm set Nurse Communication: Mobility status PT Visit Diagnosis: Difficulty in walking, not elsewhere classified (R26.2);Pain Pain - Right/Left: Right Pain - part of body: Hip     Time: [id number] PT Time Calculation (min) (ACUTE ONLY): 22 min  Charges:  $Gait Training: 8-22 mins                     Blondell Reveal Kistler PT 06/03/2022  Acute Rehabilitation Services  Office [phone]

## 2022-06-03 NOTE — Plan of Care (Signed)
Plan of care reviewed and discussed. °

## 2022-06-04 DIAGNOSIS — L0390 Cellulitis, unspecified: Secondary | ICD-10-CM | POA: Diagnosis not present

## 2022-06-04 LAB — BASIC METABOLIC PANEL
Anion gap: 8 (ref 5–15)
BUN: 10 mg/dL (ref 8–23)
CO2: 28 mmol/L (ref 22–32)
Calcium: 8.7 mg/dL — ABNORMAL LOW (ref 8.9–10.3)
Chloride: 100 mmol/L (ref 98–111)
Creatinine, Ser: 0.56 mg/dL (ref 0.44–1.00)
GFR, Estimated: >60 mL/min (ref >60)
Glucose, Bld: 100 mg/dL — ABNORMAL HIGH (ref 70–99)
Potassium: 3.7 mmol/L (ref 3.5–5.1)
Sodium: 136 mmol/L (ref 135–145)

## 2022-06-04 LAB — CBC
HCT: 27.1 % — ABNORMAL LOW (ref 36.0–46.0)
Hemoglobin: 8.4 g/dL — ABNORMAL LOW (ref 12.0–15.0)
MCH: 29.2 pg (ref 26.0–34.0)
MCHC: 31 g/dL (ref 30.0–36.0)
MCV: 94.1 fL (ref 80.0–100.0)
Platelets: 367 10*3/uL (ref 150–400)
RBC: 2.88 MIL/uL — ABNORMAL LOW (ref 3.87–5.11)
RDW: 14.3 % (ref 11.5–15.5)
WBC: 6.6 10*3/uL (ref 4.0–10.5)
nRBC: 0 % (ref 0.0–0.2)

## 2022-06-04 LAB — PHOSPHORUS: Phosphorus: 3.5 mg/dL (ref 2.5–4.6)

## 2022-06-04 LAB — MAGNESIUM: Magnesium: 1.9 mg/dL (ref 1.7–2.4)

## 2022-06-04 NOTE — Discharge Instructions (Signed)
Advised to follow-up with primary care physician in 1 week. Advised to continue pain medications as scheduled. Advised to follow-up with Orthopedics surgery as scheduled.

## 2022-06-04 NOTE — Progress Notes (Signed)
Physical Therapy Treatment Patient Details Name: Kara Fox MRN: [id number] DOB: March 10, 1955 Today's Date: 06/04/2022   History of Present Illness Pt is a 67 year old female with recent right total hip arthoplasty 05/27/22 with Dr. Lyla Glassing. She had post op periprosthetic greater trochanter fracture - no active ABDuction, WBAT.  She was discharged 05/28/22. She presented to the ED 05/30/22 with increase right hip pain and swelling.  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 Pt taking slow, step to steps with RW, good steadiness without LOB or LE buckling noted, using RW, supv for safety. Reviewed no abduction precuations prior to amb, pt verbalizes understanding and demos good understanding with gait and slow turns with increased steps. Pt reports confidence with stairs and HEP, reports only being concerned regarding needing to have BM so she can d/c home with family support.   Recommendations for follow up therapy are one component of a multi-disciplinary discharge planning process, led by the attending physician.  Recommendations may be updated based on patient status, additional functional criteria and insurance authorization.  Follow Up Recommendations       Assistance Recommended at Discharge Intermittent Supervision/Assistance  Patient can return home with the following A little help with walking and/or transfers;A little help with bathing/dressing/bathroom;Assistance with cooking/housework;Assist for transportation;Help with stairs or ramp for entrance   Equipment Recommendations  None recommended by PT    Recommendations for Other Services       Precautions / Restrictions Precautions Precautions: Fall Precaution Comments: no active ABDUCTION R hip for 6 weeks 2* periprosthetic fx Restrictions Weight  Bearing Restrictions: No Other Position/Activity Restrictions: WBAT     Mobility  Bed Mobility  General bed mobility comments: up in recliner    Transfers Overall transfer level: Needs assistance Equipment used: Rolling walker (2 wheels) Transfers: Sit to/from Stand Sit to Stand: Supervision  General transfer comment: BUE assisting to power up to standing, RLE slightly extended to power up and with return to sitting for comfort    Ambulation/Gait Ambulation/Gait assistance: Supervision Gait Distance (Feet): 50 Feet Assistive device: Rolling walker (2 wheels) Gait Pattern/deviations: Step-to pattern, Decreased stance time - right, Antalgic, Decreased weight shift to right Gait velocity: decreased  General Gait Details: slow, step-to gait pattern, decreased RLE stance time and WB, no RLE buckling or near falls   Stairs             Wheelchair Mobility    Modified Rankin (Stroke Patients Only)       Balance Overall balance assessment: Modified Independent     Cognition Arousal/Alertness: Awake/alert Behavior During Therapy: WFL for tasks assessed/performed Overall Cognitive Status: Within Functional Limits for tasks assessed       Exercises      General Comments        Pertinent Vitals/Pain Pain Assessment Pain Assessment: 0-10 Pain Score: 8  Pain Location: R hip Pain Descriptors / Indicators: Discomfort, Sore Pain Intervention(s): Limited activity within patient's tolerance, Monitored during session, Premedicated before session, Repositioned, Ice applied    Home Living                          Prior Function            PT Goals (current goals can now be found  in the care plan section) Acute Rehab PT Goals Patient Stated Goal: go on a cruise in June PT Goal Formulation: With patient Time For Goal Achievement: 06/11/22 Potential to Achieve Goals: Good Progress towards PT goals: Progressing toward goals    Frequency    Min  5X/week      PT Plan Current plan remains appropriate    Co-evaluation              AM-PAC PT "6 Clicks" Mobility   Outcome Measure  Help needed turning from your back to your side while in a flat bed without using bedrails?: A Little Help needed moving from lying on your back to sitting on the side of a flat bed without using bedrails?: A Little Help needed moving to and from a bed to a chair (including a wheelchair)?: A Little Help needed standing up from a chair using your arms (e.g., wheelchair or bedside chair)?: A Little Help needed to walk in hospital room?: A Little Help needed climbing 3-5 steps with a railing? : A Little 6 Click Score: 18    End of Session Equipment Utilized During Treatment: Gait belt Activity Tolerance: Patient tolerated treatment well Patient left: in chair;with call bell/phone within reach;with chair alarm set Nurse Communication: Mobility status PT Visit Diagnosis: Difficulty in walking, not elsewhere classified (R26.2);Pain Pain - Right/Left: Right Pain - part of body: Hip     Time: [id number] PT Time Calculation (min) (ACUTE ONLY): 21 min  Charges:  $Gait Training: 8-22 mins                      Tori Annelyse Rey PT, DPT 06/04/22, 10:03 AM

## 2022-06-04 NOTE — Discharge Summary (Signed)
Physician Discharge Summary  Kara Fox [id number] DOB: 07/25/1955 DOA: 05/30/2022  PCP: Benito Mccreedy, MD  Admit date: 05/30/2022  Discharge date: 06/04/2022  Admitted From: Home  Disposition:  Home.  Recommendations for Outpatient Follow-up:  Follow up with PCP in 1-2 weeks. Please obtain BMP/CBC in one week. Advised to continue pain medications as scheduled. Advised to follow-up with Orthopedics surgery as scheduled.  Home Health: None Equipment/Devices: None  Discharge Condition: Stable CODE STATUS:Full code Diet recommendation: Heart Healthy   Brief [facility] Facility Course: This 67 year old female with past medical history significant for opioid use disorder on methadone, hypertension, hyperlipidemia, NASH, chronic hepatitis C and recent right hip arthroplasty. Patient was admitted with c/o: right hip pain. On presentation, Her hemoglobin was found to 6.6 g/dL,  swelling of the right thigh was reported, however, orthopedic team has reassured that there was normal thigh appearance post op. Patient's right hip pain will be optimized. Repeat hemoglobin revealed hemoglobin of 9.1 g/dL.She was continued on supportive care,  pain management. She was given blood transfusion for hemoglobin 6.6 which improved to 8.2.  Orthopedics signed off.  Patient feels better and  wants to be discharged.  Pain is reasonably controlled.  Patient is being discharged home.  Discharge Diagnoses:  Principal Problem:   Wound cellulitis Active Problems:   Normocytic anemia   History of total right hip arthroplasty   History of opioid abuse (HCC)  Right thigh swelling: -Orthopedic team was consulted and has evaluated the patient -Appearance is said to be normal, considering postoperative state. -Continue postop care. -There is no evidence of infection so antibiotics were discontinued. -Mobilize out of bed with PT.   Normochromic, Normocytic Anemia: -Likely acute blood loss anemia  following surgery. -Hemoglobin of 6.6 g/dL on presentation. -s/p 1 unit PRBC > Hb improved. -Repeat hemoglobin is greater than 9 g/dL. -Continue to monitor closely. Hb 8.3 today   Right hip pain:> Improving -Continue to optimize adequate pain control. -Patient is on methadone. -Discharge once pain is controlled.   History of opioid abuse: -See above documentation.  Discharge Instructions  Discharge Instructions     Call MD for:  difficulty breathing, headache or visual disturbances   Complete by: As directed    Call MD for:  persistant dizziness or light-headedness   Complete by: As directed    Call MD for:  persistant nausea and vomiting   Complete by: As directed    Diet - low sodium heart healthy   Complete by: As directed    Diet Carb Modified   Complete by: As directed    Discharge instructions   Complete by: As directed    Advised to follow-up with primary care physician in 1 week. Advised to continue pain medications as scheduled. Advised to follow-up with Orthopedics surgery as scheduled.   Increase activity slowly   Complete by: As directed       Allergies as of 06/04/2022   No Known Allergies      Medication List     TAKE these medications    aspirin 81 MG chewable tablet Commonly known as: Aspirin Childrens Chew 1 tablet (81 mg total) by mouth 2 (two) times daily with a meal.   docusate sodium 100 MG capsule Commonly known as: Colace Take 1 capsule (100 mg total) by mouth 2 (two) times daily.   HYDROcodone-acetaminophen 5-325 MG tablet Commonly known as: NORCO/VICODIN Take 1 tablet by mouth every 4 (four) hours as needed for moderate pain or severe pain.  ibuprofen 200 MG tablet Commonly known as: ADVIL Take 200-400 mg by mouth every 6 (six) hours as needed for moderate pain.   methadone 10 MG/ML solution Commonly known as: DOLOPHINE Take 110 mg by mouth in the morning.   methocarbamol 500 MG tablet Commonly known as: ROBAXIN Take 1  tablet (500 mg total) by mouth every 6 (six) hours as needed for muscle spasms.   ondansetron 4 MG tablet Commonly known as: Zofran Take 1 tablet (4 mg total) by mouth every 8 (eight) hours as needed for nausea or vomiting.   polyethylene glycol 17 g packet Commonly known as: MiraLax Take 17 g by mouth daily as needed for mild constipation or moderate constipation.   senna 8.6 MG Tabs tablet Commonly known as: SENOKOT Take 2 tablets (17.2 mg total) by mouth at bedtime for 15 days.        Follow-up Information     Osei-Bonsu, Iona Beard, MD Follow up in 1 week(s).   Specialty: Internal Medicine Contact information: [id number] [place] [address] [id number]         Rod Can, MD Follow up in 2 week(s).   Specialty: Orthopedic Surgery Contact information: [address] [place] [address] [phone]                No Known Allergies  Consultations: Orthopeadics   Procedures/Studies: DG Pelvis Portable  Result Date: 05/27/2022 CLINICAL DATA:  Postop RIGHT total arthroplasty EXAM: PORTABLE PELVIS 1-2 VIEWS COMPARISON:  04/09/2022, intraoperative views same day FINDINGS: RIGHT hip total arthroplasty. Single AP view demonstrates a lucency beneath the RIGHT greater trochanter which extends from the cortex to the prosthetic stem. IMPRESSION: Sub trochanteric fracture along the lateral margin of the RIGHT femur. RIGHT hip total arthroplasty These results will be called to the ordering clinician or representative by the Radiologist Assistant, and communication documented in the PACS or [employer]. Electronically Signed   By: Suzy Bouchard M.D.   On: 05/27/2022 16:24   DG HIP UNILAT WITH PELVIS 1V RIGHT  Result Date: 05/27/2022 CLINICAL DATA:  Intraoperative fluoroscopy for total right hip arthroplasty. EXAM: DG HIP (WITH OR WITHOUT PELVIS) 1V RIGHT COMPARISON:  Pelvis and left hip radiographs 04/19/2022 FINDINGS: Images were  performed intraoperatively without the presence of a radiologist. The patient is undergoing total right hip arthroplasty. Partial visualization of prior total left hip arthroplasty. No hardware complication is seen. Total fluoroscopy images: 7 Total fluoroscopy time: 21 seconds Total dose: Radiation Exposure Index (as provided by the fluoroscopic device): 2.20 mGy air Kerma Please see intraoperative findings for further detail. IMPRESSION: Intraoperative fluoroscopy for total right hip arthroplasty. Electronically Signed   By: Yvonne Kendall M.D.   On: 05/27/2022 14:58   DG C-Arm 1-60 Min-No Report  Result Date: 05/27/2022 Fluoroscopy was utilized by the requesting physician.  No radiographic interpretation.   DG C-Arm 1-60 Min-No Report  Result Date: 05/27/2022 Fluoroscopy was utilized by the requesting physician.  No radiographic interpretation.    Subjective: Patient was seen and examined at bedside.  Overnight events noted.   Patient reports right leg pain is improved.  She wants to be discharged.  Discharge Exam: Vitals:   06/03/22 2055 06/04/22 0424  BP: (!) 145/83 111/81  Pulse: 86 85  Resp: 18 18  Temp: 98.7 F (37.1 C) 99.4 F (37.4 C)  SpO2: 100% 100%   Vitals:   06/03/22 0547 06/03/22 1400 06/03/22 2055 06/04/22 0424  BP: 133/83 98/82 (!) 145/83 111/81  Pulse: 92 82 86 85  Resp: 18 14 18 18   Temp: 99 F (37.2 C) 98.8 F (37.1 C) 98.7 F (37.1 C) 99.4 F (37.4 C)  TempSrc:  Oral  Oral  SpO2: 99% 100% 100% 100%  Weight:      Height:        General: Pt is alert, awake, not in acute distress Cardiovascular: RRR, S1/S2 +, no rubs, no gallops Respiratory: CTA bilaterally, no wheezing, no rhonchi Abdominal: Soft, NT, ND, bowel sounds + Extremities: no edema, no cyanosis    The results of significant diagnostics from this hospitalization (including imaging, microbiology, ancillary and laboratory) are listed below for reference.     Microbiology: Recent Results  (from the past 240 hour(s))  Blood culture (routine x 2)     Status: None (Preliminary result)   Collection Time: 05/30/22  7:30 PM   Specimen: BLOOD  Result Value Ref Range Status   Specimen Description   Final    BLOOD Performed at [facility] [address] [place], [place] [address]    Special Requests   Final    BOTTLES DRAWN AEROBIC AND ANAEROBIC Blood Culture adequate volume Performed at [facility] [address], [place], [place] [address]    Culture   Final    NO GROWTH 4 DAYS Performed at [facility], [place] [address] [place], [place] [address]    Report Status PENDING  Incomplete  Blood culture (routine x 2)     Status: None (Preliminary result)   Collection Time: 05/31/22  3:42 AM   Specimen: BLOOD  Result Value Ref Range Status   Specimen Description   Final    BLOOD Performed at [facility] [address] [place],  [address]    Special Requests   Final    BOTTLES DRAWN AEROBIC AND ANAEROBIC Blood Culture adequate volume Performed at [facility] [address], [place],  [address]    Culture   Final    NO GROWTH 4 DAYS Performed at [facility], [place] [address], [place],  [address]    Report Status PENDING  Incomplete     Labs: BNP (last 3 results) No results for input(s): "BNP" in the last 8760 hours. Basic Metabolic Panel: Recent Labs  Lab 05/30/22 1708 05/31/22 0342 06/01/22 0300 06/04/22 0344  NA 137 134* 137 136  K 3.9 3.3* 3.9 3.7  CL 103 101 103 100  CO2 23 25 25 28   GLUCOSE 94 85 122* 100*  BUN 10 9 11 10   CREATININE 0.49 0.56 0.65 0.56  CALCIUM 8.7* 8.4* 8.4* 8.7*  MG  --   --  2.1 1.9  PHOS  --   --  2.3* 3.5   Liver Function Tests: Recent Labs  Lab 06/01/22 0300  ALBUMIN 2.9*   No results for input(s): "LIPASE", "AMYLASE" in the last 168 hours. No results for input(s): "AMMONIA" in the last 168  hours. CBC: Recent Labs  Lab 05/30/22 1708 05/31/22 0342 05/31/22 1301 06/01/22 0300 06/02/22 0327 06/03/22 0339 06/04/22 0344  WBC 10.4 12.7* 10.0 7.6  --   --  6.6  NEUTROABS  --   --   --  4.8  --   --   --   HGB 6.6* 6.6* 9.1* 8.7* 8.6* 8.3* 8.4*  HCT 21.7* 20.6* 28.5* 27.5* 28.2* 26.6* 27.1*  MCV 98.2 93.6 91.9 92.6  --   --  94.1  PLT 180 286 282 297  --   --  367   Cardiac Enzymes: No results for input(s): "CKTOTAL", "CKMB", "CKMBINDEX", "TROPONINI" in the last 168 hours. BNP: Invalid input(s): "POCBNP" CBG: No results for input(s): "GLUCAP" in the last 168 hours. D-Dimer No results for input(s): "DDIMER" in the last 72 hours. Hgb A1c No results for input(s): "HGBA1C" in the last 72 hours. Lipid Profile No results for input(s): "CHOL", "HDL", "LDLCALC", "TRIG", "CHOLHDL", "LDLDIRECT" in the last 72 hours. Thyroid function studies No results for input(s): "TSH", "T4TOTAL", "T3FREE", "THYROIDAB" in the last 72 hours.  Invalid input(s): "FREET3" Anemia work up No results for input(s): "VITAMINB12", "FOLATE", "FERRITIN", "TIBC", "IRON", "RETICCTPCT" in the last 72 hours. Urinalysis No results found for: "COLORURINE", "APPEARANCEUR", "LABSPEC", "PHURINE", "GLUCOSEU", "HGBUR", "BILIRUBINUR", "KETONESUR", "PROTEINUR", "UROBILINOGEN", "NITRITE", "LEUKOCYTESUR" Sepsis Labs Recent Labs  Lab 05/31/22 0342 05/31/22 1301 06/01/22 0300 06/04/22 0344  WBC 12.7* 10.0 7.6 6.6   Microbiology Recent Results (from the past 240 hour(s))  Blood culture (routine x 2)     Status: None (Preliminary result)   Collection Time: 05/30/22  7:30 PM   Specimen: BLOOD  Result Value Ref Range Status   Specimen Description   Final    BLOOD Performed at [facility] [address] [place], [place] [address]    Special Requests   Final    BOTTLES DRAWN AEROBIC AND ANAEROBIC Blood Culture adequate volume Performed at [facility] [address], [place], [place] [address]    Culture   Final    NO GROWTH 4 DAYS Performed at [facility], [place] [address], [place], [place] [address]    Report Status PENDING  Incomplete  Blood culture (routine x 2)     Status: None (Preliminary result)   Collection Time: 05/31/22  3:42 AM   Specimen: BLOOD  Result Value Ref Range Status   Specimen Description   Final    BLOOD Performed at [facility] [address] [place], [place] [address]    Special Requests   Final    BOTTLES DRAWN AEROBIC AND ANAEROBIC Blood Culture adequate volume Performed at [facility] [address] [place], [place] [address]    Culture   Final    NO GROWTH 4 DAYS Performed at [facility], [place] [address] [place],  [address]    Report Status PENDING  Incomplete     Time coordinating discharge: Over 30 minutes  SIGNED:   Duard Brady, MD  [facility] 06/04/2022, 1:24 PM Pager   If 7PM-7AM, please contact night-coverage

## 2022-06-04 NOTE — Plan of Care (Signed)
°  Problem: Pain Management: °Goal: Pain level will decrease with appropriate interventions °Outcome: Progressing °  °Problem: Safety: °Goal: Ability to remain free from injury will improve °Outcome: Progressing °  °

## 2022-06-05 LAB — CULTURE, BLOOD (ROUTINE X 2)
Culture: NO GROWTH
Culture: NO GROWTH
Special Requests: ADEQUATE
Special Requests: ADEQUATE

## 2023-10-13 ENCOUNTER — Other Ambulatory Visit: Payer: Self-pay | Admitting: Physician Assistant

## 2023-10-13 DIAGNOSIS — Z1231 Encounter for screening mammogram for malignant neoplasm of breast: Secondary | ICD-10-CM

## 2023-11-16 ENCOUNTER — Ambulatory Visit

## 2024-03-29 ENCOUNTER — Inpatient Hospital Stay

## 2024-03-29 ENCOUNTER — Inpatient Hospital Stay: Attending: Hematology and Oncology | Admitting: Hematology and Oncology

## 2024-04-21 ENCOUNTER — Inpatient Hospital Stay: Admitting: Hematology and Oncology

## 2024-04-21 ENCOUNTER — Inpatient Hospital Stay
